# Patient Record
Sex: Female | Born: 1945 | Race: White | Hispanic: No | Marital: Married | Smoking: Current some day smoker
Health system: Southern US, Community
[De-identification: ages and names within clinical notes are randomized; demographics above are authoritative.]

## PROBLEM LIST (undated history)

## (undated) DIAGNOSIS — H269 Unspecified cataract: Secondary | ICD-10-CM

## (undated) HISTORY — PX: TONSILLECTOMY: SUR1361

## (undated) HISTORY — PX: FOOT SURGERY: SHX648

## (undated) HISTORY — DX: Unspecified cataract: H26.9

---

## 1998-04-14 ENCOUNTER — Other Ambulatory Visit: Admission: RE | Admit: 1998-04-14 | Discharge: 1998-04-14 | Payer: Self-pay | Admitting: *Deleted

## 1998-06-28 ENCOUNTER — Other Ambulatory Visit: Admission: RE | Admit: 1998-06-28 | Discharge: 1998-06-28 | Payer: Self-pay | Admitting: Obstetrics and Gynecology

## 1999-10-01 ENCOUNTER — Other Ambulatory Visit: Admission: RE | Admit: 1999-10-01 | Discharge: 1999-10-01 | Payer: Self-pay | Admitting: *Deleted

## 1999-10-15 ENCOUNTER — Encounter: Admission: RE | Admit: 1999-10-15 | Discharge: 1999-10-15 | Payer: Self-pay | Admitting: *Deleted

## 2000-10-09 ENCOUNTER — Other Ambulatory Visit: Admission: RE | Admit: 2000-10-09 | Discharge: 2000-10-09 | Payer: Self-pay | Admitting: *Deleted

## 2000-10-15 ENCOUNTER — Encounter: Admission: RE | Admit: 2000-10-15 | Discharge: 2000-10-15 | Payer: Self-pay | Admitting: Obstetrics and Gynecology

## 2000-10-15 ENCOUNTER — Encounter: Payer: Self-pay | Admitting: Obstetrics and Gynecology

## 2001-05-01 ENCOUNTER — Ambulatory Visit (HOSPITAL_COMMUNITY): Admission: RE | Admit: 2001-05-01 | Discharge: 2001-05-01 | Payer: Self-pay | Admitting: Internal Medicine

## 2001-05-01 ENCOUNTER — Encounter: Payer: Self-pay | Admitting: Internal Medicine

## 2001-10-15 ENCOUNTER — Encounter: Admission: RE | Admit: 2001-10-15 | Discharge: 2001-10-15 | Payer: Self-pay | Admitting: Obstetrics and Gynecology

## 2001-10-15 ENCOUNTER — Other Ambulatory Visit: Admission: RE | Admit: 2001-10-15 | Discharge: 2001-10-15 | Payer: Self-pay | Admitting: Obstetrics and Gynecology

## 2001-10-15 ENCOUNTER — Encounter: Payer: Self-pay | Admitting: Obstetrics and Gynecology

## 2002-10-18 ENCOUNTER — Encounter: Payer: Self-pay | Admitting: Obstetrics and Gynecology

## 2002-10-18 ENCOUNTER — Encounter: Admission: RE | Admit: 2002-10-18 | Discharge: 2002-10-18 | Payer: Self-pay | Admitting: Obstetrics and Gynecology

## 2002-10-18 ENCOUNTER — Other Ambulatory Visit: Admission: RE | Admit: 2002-10-18 | Discharge: 2002-10-18 | Payer: Self-pay | Admitting: Obstetrics & Gynecology

## 2003-04-23 ENCOUNTER — Emergency Department (HOSPITAL_COMMUNITY): Admission: EM | Admit: 2003-04-23 | Discharge: 2003-04-23 | Payer: Self-pay | Admitting: Emergency Medicine

## 2003-08-19 ENCOUNTER — Encounter: Payer: Self-pay | Admitting: Family Medicine

## 2003-08-19 ENCOUNTER — Ambulatory Visit (HOSPITAL_COMMUNITY): Admission: RE | Admit: 2003-08-19 | Discharge: 2003-08-19 | Payer: Self-pay | Admitting: Family Medicine

## 2003-10-19 ENCOUNTER — Encounter: Admission: RE | Admit: 2003-10-19 | Discharge: 2003-10-19 | Payer: Self-pay | Admitting: Obstetrics and Gynecology

## 2003-10-19 ENCOUNTER — Encounter: Payer: Self-pay | Admitting: Obstetrics and Gynecology

## 2003-10-24 ENCOUNTER — Other Ambulatory Visit: Admission: RE | Admit: 2003-10-24 | Discharge: 2003-10-24 | Payer: Self-pay | Admitting: Obstetrics & Gynecology

## 2004-07-09 ENCOUNTER — Ambulatory Visit (HOSPITAL_COMMUNITY): Admission: RE | Admit: 2004-07-09 | Discharge: 2004-07-09 | Payer: Self-pay | Admitting: Internal Medicine

## 2004-07-11 ENCOUNTER — Ambulatory Visit (HOSPITAL_COMMUNITY): Admission: RE | Admit: 2004-07-11 | Discharge: 2004-07-11 | Payer: Self-pay | Admitting: Family Medicine

## 2004-10-19 ENCOUNTER — Ambulatory Visit (HOSPITAL_COMMUNITY): Admission: RE | Admit: 2004-10-19 | Discharge: 2004-10-19 | Payer: Self-pay | Admitting: Obstetrics and Gynecology

## 2014-05-01 ENCOUNTER — Encounter (HOSPITAL_COMMUNITY): Payer: Self-pay | Admitting: Emergency Medicine

## 2014-05-01 ENCOUNTER — Emergency Department (INDEPENDENT_AMBULATORY_CARE_PROVIDER_SITE_OTHER)
Admission: EM | Admit: 2014-05-01 | Discharge: 2014-05-01 | Disposition: A | Discharge status: Home / Self Care | Payer: Medicare Other | Source: Home / Self Care | Attending: Family Medicine | Admitting: Family Medicine

## 2014-05-01 DIAGNOSIS — T148 Other injury of unspecified body region: Secondary | ICD-10-CM

## 2014-05-01 DIAGNOSIS — W57XXXA Bitten or stung by nonvenomous insect and other nonvenomous arthropods, initial encounter: Secondary | ICD-10-CM

## 2014-05-01 MED ORDER — CEPHALEXIN 500 MG PO CAPS: 500.0000 mg | ORAL_CAPSULE | Freq: Four times a day (QID) | ORAL | Status: DC

## 2014-05-01 MED ORDER — DOXYCYCLINE HYCLATE 100 MG PO CAPS: 100.0000 mg | ORAL_CAPSULE | Freq: Two times a day (BID) | ORAL | Status: DC

## 2014-05-01 NOTE — ED Notes (Signed)
Started with right foot pruritis early Sat morning; applied Benadryl cream, then later noticed tick between right 3rd & 4th toes - pt removed with tweezers.  Started with redness and swelling to foot yesterday - has not extended up to right ankle.  Denies fevers.

## 2014-05-01 NOTE — ED Provider Notes (Signed)
Michelle KohutJanet L Drake is a 68 y.o. female who presents to Urgent Care today for right foot pain and swelling. Patient can get engorged tick between her third and fourth toes yesterday. She developed rapid onset of swelling and pain in her foot. She denies any fevers or chills nausea vomiting or diarrhea. She has not tried any medications yet.   History reviewed. No pertinent past medical history. History  Substance Use Topics  . Smoking status: Current Every Day Smoker  . Smokeless tobacco: Not on file  . Alcohol Use: Yes     Comment: occasional   ROS as above Medications: No current facility-administered medications for this encounter.   Current Outpatient Prescriptions  Medication Sig Dispense Refill  . UNKNOWN TO PATIENT Multiple OTC supplements      . cephALEXin (KEFLEX) 500 MG capsule Take 1 capsule (500 mg total) by mouth 4 (four) times daily.  40 capsule  0  . doxycycline (VIBRAMYCIN) 100 MG capsule Take 1 capsule (100 mg total) by mouth 2 (two) times daily.  20 capsule  0    Exam:  BP 128/76  Pulse 83  Temp(Src) 98.1 F (36.7 C) (Oral)  Resp 16  SpO2 97% Gen: Well NAD Right foot: Erythematous extending to the mid foot and ankle area. No retained tick seen No fluctuance No results found for this or any previous visit (from the past 24 hour(s)). No results found.  Assessment and Plan: 68 y.o. female with tick bite and cellulitis. Plan to treat with doxycycline for tick borne illness, and staff. We'll use Keflex for strep. Followup with primary care provider.  Discussed warning signs or symptoms. Please see discharge instructions. Patient expresses understanding.    Rodolph BongEvan S Corretta Munce, MD 05/01/14 1309

## 2014-05-01 NOTE — Discharge Instructions (Signed)
Thank you for coming in today. Take doxycycline twice dialy.  Take keflex 4x daily.  Return as needed.   Tick Bite Information Ticks are insects that attach themselves to the skin and draw blood for food. There are various types of ticks. Common types include wood ticks and deer ticks. Most ticks live in shrubs and grassy areas. Ticks can climb onto your body when you make contact with leaves or grass where the tick is waiting. The most common places on the body for ticks to attach themselves are the scalp, neck, armpits, waist, and groin. Most tick bites are harmless, but sometimes ticks carry germs that cause diseases. These germs can be spread to a person during the tick's feeding process. The chance of a disease spreading through a tick bite depends on:   The type of tick.  Time of year.   How long the tick is attached.   Geographic location.  HOW CAN YOU PREVENT TICK BITES? Take these steps to help prevent tick bites when you are outdoors:  Wear protective clothing. Long sleeves and long pants are best.   Wear white clothes so you can see ticks more easily.  Tuck your pant legs into your socks.   If walking on a trail, stay in the middle of the trail to avoid brushing against bushes.  Avoid walking through areas with long grass.  Put insect repellent on all exposed skin and along boot tops, pant legs, and sleeve cuffs.   Check clothing, hair, and skin repeatedly and before going inside.   Brush off any ticks that are not attached.  Take a shower or bath as soon as possible after being outdoors.  WHAT IS THE PROPER WAY TO REMOVE A TICK? Ticks should be removed as soon as possible to help prevent diseases caused by tick bites. 1. If latex gloves are available, put them on before trying to remove a tick.  2. Using fine-point tweezers, grasp the tick as close to the skin as possible. You may also use curved forceps or a tick removal tool. Grasp the tick as close to  its head as possible. Avoid grasping the tick on its body. 3. Pull gently with steady upward pressure until the tick lets go. Do not twist the tick or jerk it suddenly. This may break off the tick's head or mouth parts. 4. Do not squeeze or crush the tick's body. This could force disease-carrying fluids from the tick into your body.  5. After the tick is removed, wash the bite area and your hands with soap and water or other disinfectant such as alcohol. 6. Apply a small amount of antiseptic cream or ointment to the bite site.  7. Wash and disinfect any instruments that were used.  Do not try to remove a tick by applying a hot match, petroleum jelly, or fingernail polish to the tick. These methods do not work and may increase the chances of disease being spread from the tick bite.  WHEN SHOULD YOU SEEK MEDICAL CARE? Contact your health care provider if you are unable to remove a tick from your skin or if a part of the tick breaks off and is stuck in the skin.  After a tick bite, you need to be aware of signs and symptoms that could be related to diseases spread by ticks. Contact your health care provider if you develop any of the following in the days or weeks after the tick bite:  Unexplained fever.  Rash. A circular  rash that appears days or weeks after the tick bite may indicate the possibility of Lyme disease. The rash may resemble a target with a bull's-eye and may occur at a different part of your body than the tick bite.  Redness and swelling in the area of the tick bite.   Tender, swollen lymph glands.   Diarrhea.   Weight loss.   Cough.   Fatigue.   Muscle, joint, or bone pain.   Abdominal pain.   Headache.   Lethargy or a change in your level of consciousness.  Difficulty walking or moving your legs.   Numbness in the legs.   Paralysis.  Shortness of breath.   Confusion.   Repeated vomiting.  Document Released: 12/13/2000 Document Revised:  10/06/2013 Document Reviewed: 05/26/2013 [facility] Patient Information 2014 [place], [place].   Cellulitis Cellulitis is an infection of the skin and the tissue beneath it. The infected area is usually red and tender. Cellulitis occurs most often in the arms and lower legs.  CAUSES  Cellulitis is caused by bacteria that enter the skin through cracks or cuts in the skin. The most common types of bacteria that cause cellulitis are Staphylococcus and Streptococcus. SYMPTOMS   Redness and warmth.  Swelling.  Tenderness or pain.  Fever. DIAGNOSIS  Your caregiver can usually determine what is wrong based on a physical exam. Blood tests may also be done. TREATMENT  Treatment usually involves taking an antibiotic medicine. HOME CARE INSTRUCTIONS   Take your antibiotics as directed. Finish them even if you start to feel better.  Keep the infected arm or leg elevated to reduce swelling.  Apply a warm cloth to the affected area up to 4 times per day to relieve pain.  Only take over-the-counter or prescription medicines for pain, discomfort, or fever as directed by your caregiver.  Keep all follow-up appointments as directed by your caregiver. SEEK MEDICAL CARE IF:   You notice red streaks coming from the infected area.  Your red area gets larger or turns dark in color.  Your bone or joint underneath the infected area becomes painful after the skin has healed.  Your infection returns in the same area or another area.  You notice a swollen bump in the infected area.  You develop new symptoms. SEEK IMMEDIATE MEDICAL CARE IF:   You have a fever.  You feel very sleepy.  You develop vomiting or diarrhea.  You have a general ill feeling (malaise) with muscle aches and pains. MAKE SURE YOU:   Understand these instructions.  Will watch your condition.  Will get help right away if you are not doing well or get worse. Document Released: 09/25/2005 Document Revised: 06/16/2012  Document Reviewed: 03/02/2012 [facility] Patient Information 2014 [place], [place].

## 2014-05-02 NOTE — ED Notes (Signed)
Called to report nausea, upset stomach. Spoke w Marina GravelL Presson, PA, who authorized Zofran 4 mg ODT x 20 . [place] [facility] [place] called in at pt request, spoke w pharmacist

## 2014-05-19 DIAGNOSIS — Z6828 Body mass index (BMI) 28.0-28.9, adult: Secondary | ICD-10-CM | POA: Diagnosis not present

## 2014-05-19 DIAGNOSIS — N390 Urinary tract infection, site not specified: Secondary | ICD-10-CM | POA: Diagnosis not present

## 2014-09-03 DIAGNOSIS — T6391XA Toxic effect of contact with unspecified venomous animal, accidental (unintentional), initial encounter: Secondary | ICD-10-CM | POA: Diagnosis not present

## 2014-09-19 DIAGNOSIS — Z6828 Body mass index (BMI) 28.0-28.9, adult: Secondary | ICD-10-CM | POA: Diagnosis not present

## 2014-09-19 DIAGNOSIS — J984 Other disorders of lung: Secondary | ICD-10-CM | POA: Diagnosis not present

## 2015-02-20 DIAGNOSIS — J209 Acute bronchitis, unspecified: Secondary | ICD-10-CM | POA: Diagnosis not present

## 2015-02-20 DIAGNOSIS — Z6828 Body mass index (BMI) 28.0-28.9, adult: Secondary | ICD-10-CM | POA: Diagnosis not present

## 2016-06-25 ENCOUNTER — Encounter (HOSPITAL_COMMUNITY): Payer: Self-pay | Admitting: Emergency Medicine

## 2016-06-25 ENCOUNTER — Ambulatory Visit (HOSPITAL_COMMUNITY)
Admission: EM | Admit: 2016-06-25 | Discharge: 2016-06-25 | Disposition: A | Discharge status: Home / Self Care | Payer: Medicare Other | Attending: Family Medicine | Admitting: Family Medicine

## 2016-06-25 DIAGNOSIS — W57XXXA Bitten or stung by nonvenomous insect and other nonvenomous arthropods, initial encounter: Secondary | ICD-10-CM | POA: Diagnosis not present

## 2016-06-25 DIAGNOSIS — T148 Other injury of unspecified body region: Secondary | ICD-10-CM

## 2016-06-25 MED ORDER — TRIAMCINOLONE ACETONIDE 0.1 % EX CREA: 1.0000 "application " | TOPICAL_CREAM | Freq: Two times a day (BID) | CUTANEOUS | Status: DC

## 2016-06-25 NOTE — ED Notes (Signed)
[facility] a [facility] possible [facility] bite that occurred 2 days ago. The patient stated that the site is warm to the touch, inflamed and itching.

## 2016-06-25 NOTE — ED Provider Notes (Signed)
CSN: [id number]     Arrival date & time 06/25/16  1348 History   First MD Initiated Contact with Patient 06/25/16 1408     Chief Complaint  Patient presents with  . Insect Bite   (Consider location/radiation/quality/duration/timing/severity/associated sxs/prior Treatment) HPI Comments: 70 year old female presents to the urgent care with complaint of insect bite to the right elbow. This occurred approximately 2 evenings ago. It is associated with itching and mild erythema and swelling. She states the swelling was worse yesterday but has subsided significantly today. I systemic symptoms. She is fully awake and alert, jovial, laughing and showing no signs of distress.   History reviewed. No pertinent past medical history. Past Surgical History  Procedure Laterality Date  . Foot surgery      x2  . Tonsillectomy    . Cesarean section     History reviewed. No pertinent family history. Social History  Substance Use Topics  . Smoking status: Current Every Day Smoker  . Smokeless tobacco: None  . Alcohol Use: Yes     Comment: occasional   OB History    No data available     Review of Systems  Constitutional: Negative.   HENT: Negative.   Respiratory: Negative for cough and shortness of breath.   Cardiovascular: Negative for chest pain.  Gastrointestinal: Negative.   Skin: Positive for color change.  Neurological: Negative.   All other systems reviewed and are negative.   Allergies  Other and Sulfa antibiotics  Home Medications   Prior to Admission medications   Medication Sig Start Date End Date Taking? Authorizing Provider  cephALEXin (KEFLEX) 500 MG capsule Take 1 capsule (500 mg total) by mouth 4 (four) times daily. 05/01/14   Rodolph BongEvan S Corey, MD  doxycycline (VIBRAMYCIN) 100 MG capsule Take 1 capsule (100 mg total) by mouth 2 (two) times daily. 05/01/14   Rodolph BongEvan S Corey, MD  triamcinolone cream (KENALOG) 0.1 % Apply 1 application topically 2 (two) times daily. 06/25/16   Hayden Rasmussenavid  Cornella Emmer, NP  UNKNOWN TO PATIENT Multiple OTC supplements    Historical Provider, MD   Meds Ordered and Administered this Visit  Medications - No data to display  BP 145/85 mmHg  Pulse 90  Temp(Src) 97.7 F (36.5 C) (Oral)  Resp 16  SpO2 98% No data found.   Physical Exam  Constitutional: She is oriented to person, place, and time. She appears well-developed and well-nourished. No distress.  Eyes: EOM are normal.  Neck: Normal range of motion. Neck supple.  Cardiovascular: Normal rate and regular rhythm.   Pulmonary/Chest: Effort normal. No respiratory distress. She has no wheezes.  Musculoskeletal: She exhibits no edema.  Neurological: She is alert and oriented to person, place, and time. She exhibits normal muscle tone.  Skin: Skin is warm and dry.  There is a 1 cm diameter roughly annular lesion to the right elbow. Under magnification this is a small crop of raised vesicles over light erythematous base. It has been using a scant amount of clear to honey-colored fluid producing a light crust over the surface. There is minor, light cutaneous erythema surrounding the elbow extending approximately 3-4 cm out. Currently no swelling. No apparent joint involvement. No tenderness. Exhibits full range of motion of the elbow without pain or limitation. There is no lymphangitis or evidence of cellulitis.  Psychiatric: She has a normal mood and affect.  Nursing note and vitals reviewed.   ED Course  Procedures (including critical care time)  Labs Review Labs Reviewed - No  data to display  Imaging Review No results found.   Visual Acuity Review  Right Eye Distance:   Left Eye Distance:   Bilateral Distance:    Right Eye Near:   Left Eye Near:    Bilateral Near:         MDM   1. Insect bite   Localized reaction only..  Use the triamcinolone cream twice a day. May also apply the Caladryl lotion do not apply it at the same time as the triamcinolone cream. Apply cold  compresses to the areas of itching. PREVENTION   Use insect repellent. The best insect repellents contain:  DEET, picaridin, oil of lemon eucalyptus (OLE), or IR3535.  Higher amounts of an active ingredient.  When you are outdoors, wear clothing that covers your arms and legs.  Avoid opening windows that do not have window screens. SEEK MEDICAL CARE IF:  You have increased redness, swelling, or pain in the bite area.  You have a fever. SEEK IMMEDIATE MEDICAL CARE IF:   You have joint pain.   You have fluid, blood, or pus coming from the bite area.  You have a headache or neck pain.  You have unusual weakness.  You have a rash.  You have chest pain or shortness of breath.  You have abdominal pain, nausea, or vomiting.  You feel unusually tired or sleepy.   This information is not intended to replace advice given to you by your health care provider. Make sure you discuss any questions you have with your health care provider.   Document Released: 01/23/2005 Document Revised: 09/06/2015 Document Reviewed: 05/03/2015 Elsevier Interactive Patient Education [address]2016 Elsevier [employer].      Hayden Rasmussenavid Efrat Zuidema, TexasNP 06/25/16 [phone]

## 2017-04-25 ENCOUNTER — Encounter (HOSPITAL_COMMUNITY): Payer: Self-pay | Admitting: *Deleted

## 2017-04-25 ENCOUNTER — Emergency Department (HOSPITAL_COMMUNITY): Payer: Medicare Other

## 2017-04-25 ENCOUNTER — Emergency Department (HOSPITAL_COMMUNITY)
Admission: EM | Admit: 2017-04-25 | Discharge: 2017-04-25 | Disposition: A | Discharge status: Home / Self Care | Payer: Medicare Other | Attending: Emergency Medicine | Admitting: Emergency Medicine

## 2017-04-25 DIAGNOSIS — S93401A Sprain of unspecified ligament of right ankle, initial encounter: Secondary | ICD-10-CM | POA: Insufficient documentation

## 2017-04-25 DIAGNOSIS — M7989 Other specified soft tissue disorders: Secondary | ICD-10-CM | POA: Diagnosis not present

## 2017-04-25 DIAGNOSIS — M79662 Pain in left lower leg: Secondary | ICD-10-CM | POA: Diagnosis not present

## 2017-04-25 DIAGNOSIS — S99912A Unspecified injury of left ankle, initial encounter: Secondary | ICD-10-CM | POA: Diagnosis not present

## 2017-04-25 DIAGNOSIS — Y9389 Activity, other specified: Secondary | ICD-10-CM | POA: Insufficient documentation

## 2017-04-25 DIAGNOSIS — Y929 Unspecified place or not applicable: Secondary | ICD-10-CM | POA: Diagnosis not present

## 2017-04-25 DIAGNOSIS — M25572 Pain in left ankle and joints of left foot: Secondary | ICD-10-CM | POA: Diagnosis not present

## 2017-04-25 DIAGNOSIS — Y999 Unspecified external cause status: Secondary | ICD-10-CM | POA: Insufficient documentation

## 2017-04-25 DIAGNOSIS — F17200 Nicotine dependence, unspecified, uncomplicated: Secondary | ICD-10-CM | POA: Insufficient documentation

## 2017-04-25 DIAGNOSIS — S93402A Sprain of unspecified ligament of left ankle, initial encounter: Secondary | ICD-10-CM | POA: Insufficient documentation

## 2017-04-25 DIAGNOSIS — S8992XA Unspecified injury of left lower leg, initial encounter: Secondary | ICD-10-CM | POA: Diagnosis not present

## 2017-04-25 DIAGNOSIS — W109XXA Fall (on) (from) unspecified stairs and steps, initial encounter: Secondary | ICD-10-CM | POA: Diagnosis not present

## 2017-04-25 MED ORDER — OXYCODONE-ACETAMINOPHEN 5-325 MG PO TABS
1.0000 | ORAL_TABLET | Freq: Once | ORAL | Status: AC
  Administered 2017-04-25: 1 via ORAL
  Filled 2017-04-25: qty 1

## 2017-04-25 MED ORDER — TRAMADOL HCL 50 MG PO TABS: 50.0000 mg | ORAL_TABLET | Freq: Four times a day (QID) | ORAL | 0 refills | Status: DC | PRN

## 2017-04-25 NOTE — ED Notes (Signed)
Pt alert & oriented x4, stable gait. Patient given discharge instructions, paperwork & prescription(s). Patient verbalized understanding. Pt left department in wheelchair pt left w/ no further questions.

## 2017-04-25 NOTE — ED Triage Notes (Signed)
Trying to keep geriatric dog from falling down stairs and slipped hyperextending both feet Now with bilateral foot pain  [facility]

## 2017-04-25 NOTE — Discharge Instructions (Signed)
Elevate and apply ice packs on/off to your ankles.  You may want to use a pair of crutches for few days for support.  Call the orthopedic doctor listed to arrange a follow-up appt in one week if the pain is not improving

## 2017-04-27 NOTE — ED Provider Notes (Signed)
[facility]EM[facility]Y DEPT Provider Note   CSN: [id number] Arrival date & time: 04/25/17  1933     History   Chief Complaint Chief Complaint  Patient presents with  . Ankle Pain    HPI Michelle Drake is a 71 y.o. female.  HPI   Michelle Drake is a 71 y.o. female who presents to the Emergency Department complaining of bilateral  Ankle pain that began after a mechanical fall that occurred shortly before ER arrival.  She states that she slipped down one step while trying to prevent her elderly dog from falling.  She complains of lateral ankle pain and swelling that is worse with movement and weight bearing and tenderness of the lateral left lower leg.  She denies other injuries, numbness   History reviewed. No pertinent past medical history.  There are no active problems to display for this patient.   Past Surgical History:  Procedure Laterality Date  . CESAREAN SECTION    . FOOT SURGERY     x2  . TONSILLECTOMY      OB History    No data available       Home Medications    Prior to Admission medications   Medication Sig Start Date End Date Taking? Authorizing Provider  Multiple Vitamin (MULTIVITAMIN WITH MINERALS) TABS tablet Take 1 tablet by mouth daily.   Yes Historical Provider, MD  traMADol (ULTRAM) 50 MG tablet Take 1 tablet (50 mg total) by mouth every 6 (six) hours as needed. 04/25/17   Sitara Cashwell, PA-C    Family History History reviewed. No pertinent family history.  Social History Social History  Substance Use Topics  . Smoking status: Current Some Day Smoker  . Smokeless tobacco: Never Used  . Alcohol use Yes     Comment: occasional     Allergies   Other and Sulfa antibiotics   Review of Systems Review of Systems  Constitutional: Negative for chills and fever.  Genitourinary: Negative for difficulty urinating and dysuria.  Musculoskeletal: Positive for arthralgias and joint swelling.  Skin: Negative for color change and wound.  All other  systems reviewed and are negative.    Physical Exam Updated Vital Signs BP 128/60 (BP Location: Left Arm)   Pulse 87   Temp 98.1 F (36.7 C) (Oral)   Resp 15   Ht  (1.575 m)   Wt 70.3 kg   SpO2 100%   BMI 28.35 kg/m   Physical Exam  Constitutional: She is oriented to person, place, and time. She appears well-developed and well-nourished. No distress.  HENT:  Head: Normocephalic and atraumatic.  Neck: Normal range of motion.  Cardiovascular: Normal rate, regular rhythm and intact distal pulses.   Pulmonary/Chest: Effort normal and breath sounds normal. She exhibits no tenderness.  Musculoskeletal: She exhibits edema and tenderness. She exhibits no deformity.  ttp and mild edema of lateral aspect of bilateral ankles, right > left.  Mild tenderness of the lateral left lower leg, no edema.  No erythema, abrasion, bruising or bony deformity.  No proximal tenderness. Compartments soft  Neurological: She is alert and oriented to person, place, and time. She exhibits normal muscle tone. Coordination normal.  Skin: Skin is warm and dry. Capillary refill takes less than 2 seconds.  Nursing note and vitals reviewed.    ED Treatments / Results  Labs (all labs ordered are listed, but only abnormal results are displayed) Labs Reviewed - No data to display  EKG  EKG Interpretation None  Radiology Dg Tibia/fibula Left  Result Date: 04/25/2017 CLINICAL DATA:  Pain after fall EXAM: LEFT TIBIA AND FIBULA - 2 VIEW COMPARISON:  None. FINDINGS: The distal tibia and fibula were described on the ankle films. No fractures in the proximal tibia or fibula. IMPRESSION: No fractures in the proximal tibia or fibula. Electronically Signed   By: Gerome Sam III M.D   On: 04/25/2017 21:03   Dg Ankle Complete Left  Result Date: 04/25/2017 CLINICAL DATA:  Pain after trauma EXAM: LEFT ANKLE COMPLETE - 3+ VIEW COMPARISON:  None. FINDINGS: Bilateral soft tissue swelling. The ankle mortise  is intact. Irregularity of both malleoli has a primarily chronic appearance. No definitive acute fracture. Degenerative changes in the midfoot. An unusual contour to the talus on the lateral view may be due to positioning, a developmental variant, or previous trauma. No definitive acute fracture. No other abnormalities. IMPRESSION: 1. No acute fracture identified. Evidence of previous trauma with irregularity of the bilateral malleoli. Unusual contour to the talus is favored to be nonacute as well. Bilateral soft tissue swelling. Electronically Signed   By: Gerome Sam III M.D   On: 04/25/2017 21:02   Dg Ankle Complete Right  Result Date: 04/25/2017 CLINICAL DATA:  Pain after fall. EXAM: RIGHT ANKLE - COMPLETE 3+ VIEW COMPARISON:  None. FINDINGS: Lateral soft tissue swelling. No fractures. The ankle mortise is intact. Degenerative changes in the talonavicular joint. No acute abnormalities. IMPRESSION: Lateral soft tissue swelling with no identified fracture. Degenerative changes as noted above. Electronically Signed   By: Gerome Sam III M.D   On: 04/25/2017 20:59    Procedures Procedures (including critical care time)  Medications Ordered in ED Medications  oxyCODONE-acetaminophen (PERCOCET/ROXICET) 5-325 MG per tablet 1 tablet (1 tablet Oral Given 04/25/17 2132)     Initial Impression / Assessment and Plan / ED Course  I have reviewed the triage vital signs and the nursing notes.  Pertinent labs & imaging results that were available during my care of the patient were reviewed by me and considered in my medical decision making (see chart for details).     XR's neg for acute fx's.  NV intact.    Bilateral ASO applied by nursing.  Pain improved.  Pt has crutches at home.  agees to RICE therapy.  Orthopedic f/u if needed.  Final Clinical Impressions(s) / ED Diagnoses   Final diagnoses:  Sprain of right ankle, unspecified ligament, initial encounter  Sprain of left ankle,  unspecified ligament, initial encounter    New Prescriptions Discharge Medication List as of 04/25/2017  9:43 PM    START taking these medications   Details  traMADol (ULTRAM) 50 MG tablet Take 1 tablet (50 mg total) by mouth every 6 (six) hours as needed., Starting Fri 04/25/2017, Print         Kelechi Orgeron Haynes, PA-C 04/27/17 1259    Bethann Berkshire, MD 04/28/17 1300

## 2017-07-29 ENCOUNTER — Emergency Department (HOSPITAL_COMMUNITY)
Admission: EM | Admit: 2017-07-29 | Discharge: 2017-07-29 | Disposition: A | Discharge status: Home / Self Care | Payer: Medicare Other | Attending: Emergency Medicine | Admitting: Emergency Medicine

## 2017-07-29 ENCOUNTER — Emergency Department (HOSPITAL_COMMUNITY): Payer: Medicare Other

## 2017-07-29 ENCOUNTER — Encounter (HOSPITAL_COMMUNITY): Payer: Self-pay | Admitting: Emergency Medicine

## 2017-07-29 DIAGNOSIS — Z79899 Other long term (current) drug therapy: Secondary | ICD-10-CM | POA: Insufficient documentation

## 2017-07-29 DIAGNOSIS — F17200 Nicotine dependence, unspecified, uncomplicated: Secondary | ICD-10-CM | POA: Diagnosis not present

## 2017-07-29 DIAGNOSIS — M25462 Effusion, left knee: Secondary | ICD-10-CM | POA: Diagnosis not present

## 2017-07-29 DIAGNOSIS — M25562 Pain in left knee: Secondary | ICD-10-CM | POA: Diagnosis not present

## 2017-07-29 DIAGNOSIS — M7989 Other specified soft tissue disorders: Secondary | ICD-10-CM | POA: Diagnosis not present

## 2017-07-29 MED ORDER — ACETAMINOPHEN 500 MG PO TABS
1000.0000 mg | ORAL_TABLET | Freq: Once | ORAL | Status: AC
  Administered 2017-07-29: 1000 mg via ORAL
  Filled 2017-07-29: qty 2

## 2017-07-29 NOTE — ED Triage Notes (Addendum)
Pt going down steps and heard a pop in left knee. States sprained both ankles 3 months ago so increased pressure on knees. Nad. No obvious deformity but mild swelling noted to left knee.

## 2017-07-29 NOTE — ED Provider Notes (Signed)
[facility] DEPT Provider Note   CSN: [id number] Arrival date & time: 07/29/17  1247     History   Chief Complaint Chief Complaint  Patient presents with  . Knee Pain    HPI Mellissa KohutJanet L Tomaszewski is a 71 y.o. female with a history of bilateral ankle sprains about 3 months ago who has been having increased knee pain bilaterally.  Yesterday she was walking up stairs and felt her left knee pop with pain in the back.  She said it felt like her knee was trying to go backwards.  She denies numbness or tingling.  She took one ibuprofen about 2 hours PTA for her pain.   HPI  History reviewed. No pertinent past medical history.  There are no active problems to display for this patient.   Past Surgical History:  Procedure Laterality Date  . CESAREAN SECTION    . FOOT SURGERY     x2  . TONSILLECTOMY      OB History    No data available       Home Medications    Prior to Admission medications   Medication Sig Start Date End Date Taking? Authorizing Provider  Multiple Vitamin (MULTIVITAMIN WITH MINERALS) TABS tablet Take 1 tablet by mouth daily.    [provider]  traMADol (ULTRAM) 50 MG tablet Take 1 tablet (50 mg total) by mouth every 6 (six) hours as needed. 04/25/17   Pauline Ausriplett, Tammy, PA-C    Family History History reviewed. No pertinent family history.  Social History Social History  Substance Use Topics  . Smoking status: Current Some Day Smoker  . Smokeless tobacco: Never Used  . Alcohol use Yes     Comment: occasional     Allergies   Other and Sulfa antibiotics   Review of Systems Review of Systems  Constitutional: Negative for appetite change and fever.  Musculoskeletal: Positive for arthralgias. Negative for back pain, joint swelling and myalgias.  Skin: Negative for color change and wound.  All other systems reviewed and are negative.    Physical Exam Updated Vital Signs BP 133/73 (BP Location: Right Arm)   Pulse 94   Temp 98.4 F (36.9  C) (Oral)   Resp 18   Ht 5\' 1"  (1.549 m)   Wt 69.9 kg (154 lb)   SpO2 98%   BMI 29.10 kg/m   Physical Exam  Constitutional: She appears well-developed and well-nourished.  HENT:  Head: Normocephalic and atraumatic.  Musculoskeletal:  Pain and mild swelling to left knee. Knee is grossly stable to anterior/posterior drawer test and pelvis/varus stress. Left foot is warm and well perfused with intact motor and sensory function.  No tenderness to proximal tibia or fibula.   Neurological: She is alert. No sensory deficit.  Skin: Skin is warm and dry. She is not diaphoretic.  Psychiatric: She has a normal mood and affect. Her behavior is normal.  Nursing note and vitals reviewed.    ED Treatments / Results  Labs (all labs ordered are listed, but only abnormal results are displayed) Labs Reviewed - No data to display  EKG  EKG Interpretation None       Radiology Dg Knee Complete 4 Views Left  Result Date: 07/29/2017 CLINICAL DATA:  Pop in the left knee going downstairs with swelling. Initial encounter. EXAM: LEFT KNEE - COMPLETE 4+ VIEW COMPARISON:  Tibia radiograph 04/25/2017 FINDINGS: No evidence of fracture, dislocation, or joint effusion. Minimal marginal spurring without detected joint narrowing. IMPRESSION: No acute finding. Electronically Signed  By: Marnee SpringJonathon  Watts M.D.   On: 07/29/2017 13:12    Procedures Procedures (including critical care time)  Medications Ordered in ED Medications  acetaminophen (TYLENOL) tablet 1,000 mg (1,000 mg Oral Given 07/29/17 1500)     Initial Impression / Assessment and Plan / ED Course  I have reviewed the triage vital signs and the nursing notes.  Pertinent labs & imaging results that were available during my care of the patient were reviewed by me and considered in my medical decision making (see chart for details).    Mellissa KohutJanet L Niu presents with acute on chronic left knee pain in her left knee after hearing a pop while  walking up steps.  Foot is warm and well perfused.  Knee is grossly stable to my physical exam. Imaging obtained with out evidence of acute abnormality or joint effusion.  Patient given instructions on OTC pain medication and given follow-up with orthopedics. She was given Tylenol, knee immobilizer, and crutches while in the emergency room. Patient given return precautions, states her understanding.   The patient was also seen by Dr. Rhunette CroftNanavati who agrees with my plan.    Final Clinical Impressions(s) / ED Diagnoses   Final diagnoses:  Acute pain of left knee    New Prescriptions New Prescriptions   No medications on file     Norman ClayHammond, Zeanna Sunde W, PA-C 07/29/17 1502    Derwood KaplanNanavati, Ankit, MD 07/29/17 [phone]

## 2017-07-29 NOTE — Discharge Instructions (Signed)

## 2017-08-13 DIAGNOSIS — M222X1 Patellofemoral disorders, right knee: Secondary | ICD-10-CM | POA: Diagnosis not present

## 2017-08-13 DIAGNOSIS — Z8739 Personal history of other diseases of the musculoskeletal system and connective tissue: Secondary | ICD-10-CM | POA: Diagnosis not present

## 2017-08-13 DIAGNOSIS — M222X2 Patellofemoral disorders, left knee: Secondary | ICD-10-CM | POA: Diagnosis not present

## 2017-08-13 DIAGNOSIS — M25562 Pain in left knee: Secondary | ICD-10-CM | POA: Diagnosis not present

## 2017-08-14 DIAGNOSIS — S93431S Sprain of tibiofibular ligament of right ankle, sequela: Secondary | ICD-10-CM | POA: Diagnosis not present

## 2017-08-14 DIAGNOSIS — M25561 Pain in right knee: Secondary | ICD-10-CM | POA: Diagnosis not present

## 2017-08-14 DIAGNOSIS — S93432S Sprain of tibiofibular ligament of left ankle, sequela: Secondary | ICD-10-CM | POA: Diagnosis not present

## 2017-08-14 DIAGNOSIS — M25562 Pain in left knee: Secondary | ICD-10-CM | POA: Diagnosis not present

## 2017-08-19 DIAGNOSIS — M25561 Pain in right knee: Secondary | ICD-10-CM | POA: Diagnosis not present

## 2017-08-19 DIAGNOSIS — M25562 Pain in left knee: Secondary | ICD-10-CM | POA: Diagnosis not present

## 2017-08-19 DIAGNOSIS — S93431S Sprain of tibiofibular ligament of right ankle, sequela: Secondary | ICD-10-CM | POA: Diagnosis not present

## 2017-08-19 DIAGNOSIS — S93432S Sprain of tibiofibular ligament of left ankle, sequela: Secondary | ICD-10-CM | POA: Diagnosis not present

## 2017-08-21 DIAGNOSIS — S93431S Sprain of tibiofibular ligament of right ankle, sequela: Secondary | ICD-10-CM | POA: Diagnosis not present

## 2017-08-21 DIAGNOSIS — M25561 Pain in right knee: Secondary | ICD-10-CM | POA: Diagnosis not present

## 2017-08-21 DIAGNOSIS — M25562 Pain in left knee: Secondary | ICD-10-CM | POA: Diagnosis not present

## 2017-08-21 DIAGNOSIS — S93432S Sprain of tibiofibular ligament of left ankle, sequela: Secondary | ICD-10-CM | POA: Diagnosis not present

## 2017-08-26 DIAGNOSIS — S93431S Sprain of tibiofibular ligament of right ankle, sequela: Secondary | ICD-10-CM | POA: Diagnosis not present

## 2017-08-26 DIAGNOSIS — M25561 Pain in right knee: Secondary | ICD-10-CM | POA: Diagnosis not present

## 2017-08-26 DIAGNOSIS — S93432S Sprain of tibiofibular ligament of left ankle, sequela: Secondary | ICD-10-CM | POA: Diagnosis not present

## 2017-08-26 DIAGNOSIS — M25562 Pain in left knee: Secondary | ICD-10-CM | POA: Diagnosis not present

## 2017-08-28 DIAGNOSIS — S93432S Sprain of tibiofibular ligament of left ankle, sequela: Secondary | ICD-10-CM | POA: Diagnosis not present

## 2017-08-28 DIAGNOSIS — M25561 Pain in right knee: Secondary | ICD-10-CM | POA: Diagnosis not present

## 2017-08-28 DIAGNOSIS — M25562 Pain in left knee: Secondary | ICD-10-CM | POA: Diagnosis not present

## 2017-08-28 DIAGNOSIS — S93431S Sprain of tibiofibular ligament of right ankle, sequela: Secondary | ICD-10-CM | POA: Diagnosis not present

## 2017-09-02 DIAGNOSIS — S93432S Sprain of tibiofibular ligament of left ankle, sequela: Secondary | ICD-10-CM | POA: Diagnosis not present

## 2017-09-02 DIAGNOSIS — M25562 Pain in left knee: Secondary | ICD-10-CM | POA: Diagnosis not present

## 2017-09-02 DIAGNOSIS — S93431S Sprain of tibiofibular ligament of right ankle, sequela: Secondary | ICD-10-CM | POA: Diagnosis not present

## 2017-09-02 DIAGNOSIS — M25561 Pain in right knee: Secondary | ICD-10-CM | POA: Diagnosis not present

## 2017-09-04 DIAGNOSIS — M25562 Pain in left knee: Secondary | ICD-10-CM | POA: Diagnosis not present

## 2017-09-04 DIAGNOSIS — M25561 Pain in right knee: Secondary | ICD-10-CM | POA: Diagnosis not present

## 2017-09-04 DIAGNOSIS — S93431S Sprain of tibiofibular ligament of right ankle, sequela: Secondary | ICD-10-CM | POA: Diagnosis not present

## 2017-09-04 DIAGNOSIS — S93432S Sprain of tibiofibular ligament of left ankle, sequela: Secondary | ICD-10-CM | POA: Diagnosis not present

## 2017-09-09 DIAGNOSIS — M25562 Pain in left knee: Secondary | ICD-10-CM | POA: Diagnosis not present

## 2017-09-09 DIAGNOSIS — S93431S Sprain of tibiofibular ligament of right ankle, sequela: Secondary | ICD-10-CM | POA: Diagnosis not present

## 2017-09-09 DIAGNOSIS — M25561 Pain in right knee: Secondary | ICD-10-CM | POA: Diagnosis not present

## 2017-09-09 DIAGNOSIS — S93432S Sprain of tibiofibular ligament of left ankle, sequela: Secondary | ICD-10-CM | POA: Diagnosis not present

## 2017-09-11 DIAGNOSIS — S93432S Sprain of tibiofibular ligament of left ankle, sequela: Secondary | ICD-10-CM | POA: Diagnosis not present

## 2017-09-11 DIAGNOSIS — M25562 Pain in left knee: Secondary | ICD-10-CM | POA: Diagnosis not present

## 2017-09-11 DIAGNOSIS — M25561 Pain in right knee: Secondary | ICD-10-CM | POA: Diagnosis not present

## 2017-09-11 DIAGNOSIS — S93431S Sprain of tibiofibular ligament of right ankle, sequela: Secondary | ICD-10-CM | POA: Diagnosis not present

## 2017-09-15 DIAGNOSIS — M25561 Pain in right knee: Secondary | ICD-10-CM | POA: Diagnosis not present

## 2017-09-15 DIAGNOSIS — M25562 Pain in left knee: Secondary | ICD-10-CM | POA: Diagnosis not present

## 2017-09-15 DIAGNOSIS — S93432S Sprain of tibiofibular ligament of left ankle, sequela: Secondary | ICD-10-CM | POA: Diagnosis not present

## 2017-09-15 DIAGNOSIS — S93431S Sprain of tibiofibular ligament of right ankle, sequela: Secondary | ICD-10-CM | POA: Diagnosis not present

## 2017-09-18 DIAGNOSIS — S93431S Sprain of tibiofibular ligament of right ankle, sequela: Secondary | ICD-10-CM | POA: Diagnosis not present

## 2017-09-18 DIAGNOSIS — S93432S Sprain of tibiofibular ligament of left ankle, sequela: Secondary | ICD-10-CM | POA: Diagnosis not present

## 2017-09-18 DIAGNOSIS — M25561 Pain in right knee: Secondary | ICD-10-CM | POA: Diagnosis not present

## 2017-09-18 DIAGNOSIS — M25562 Pain in left knee: Secondary | ICD-10-CM | POA: Diagnosis not present

## 2017-09-23 DIAGNOSIS — M25562 Pain in left knee: Secondary | ICD-10-CM | POA: Diagnosis not present

## 2017-09-23 DIAGNOSIS — M25561 Pain in right knee: Secondary | ICD-10-CM | POA: Diagnosis not present

## 2017-09-23 DIAGNOSIS — S93431S Sprain of tibiofibular ligament of right ankle, sequela: Secondary | ICD-10-CM | POA: Diagnosis not present

## 2017-09-23 DIAGNOSIS — S93432S Sprain of tibiofibular ligament of left ankle, sequela: Secondary | ICD-10-CM | POA: Diagnosis not present

## 2017-09-24 DIAGNOSIS — Z8739 Personal history of other diseases of the musculoskeletal system and connective tissue: Secondary | ICD-10-CM | POA: Diagnosis not present

## 2017-09-24 DIAGNOSIS — M222X1 Patellofemoral disorders, right knee: Secondary | ICD-10-CM | POA: Diagnosis not present

## 2017-09-24 DIAGNOSIS — M222X2 Patellofemoral disorders, left knee: Secondary | ICD-10-CM | POA: Diagnosis not present

## 2017-09-24 DIAGNOSIS — M25562 Pain in left knee: Secondary | ICD-10-CM | POA: Diagnosis not present

## 2017-09-25 DIAGNOSIS — M25561 Pain in right knee: Secondary | ICD-10-CM | POA: Diagnosis not present

## 2017-09-25 DIAGNOSIS — S93431S Sprain of tibiofibular ligament of right ankle, sequela: Secondary | ICD-10-CM | POA: Diagnosis not present

## 2017-09-25 DIAGNOSIS — M25562 Pain in left knee: Secondary | ICD-10-CM | POA: Diagnosis not present

## 2017-09-25 DIAGNOSIS — S93432S Sprain of tibiofibular ligament of left ankle, sequela: Secondary | ICD-10-CM | POA: Diagnosis not present

## 2018-10-01 IMAGING — DX DG KNEE COMPLETE 4+V*L*
4 series · 4 of 4 positions shown · non-contrast
Comparison: Tibia radiograph 04/25/2017

CLINICAL DATA: Pop in the left knee going downstairs with swelling.
Initial encounter.

EXAM:
LEFT KNEE - COMPLETE 4+ VIEW

[knee ap]
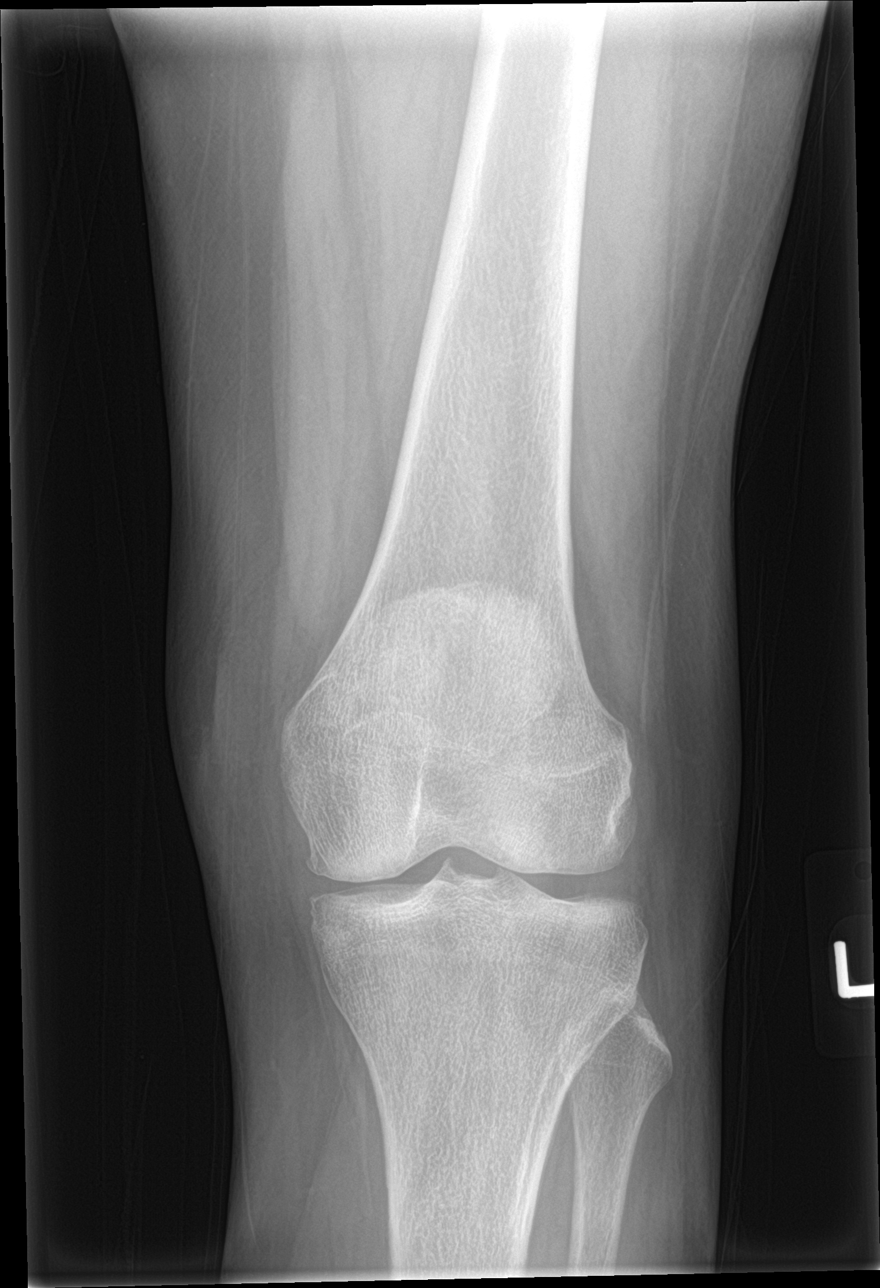

[tunnel]
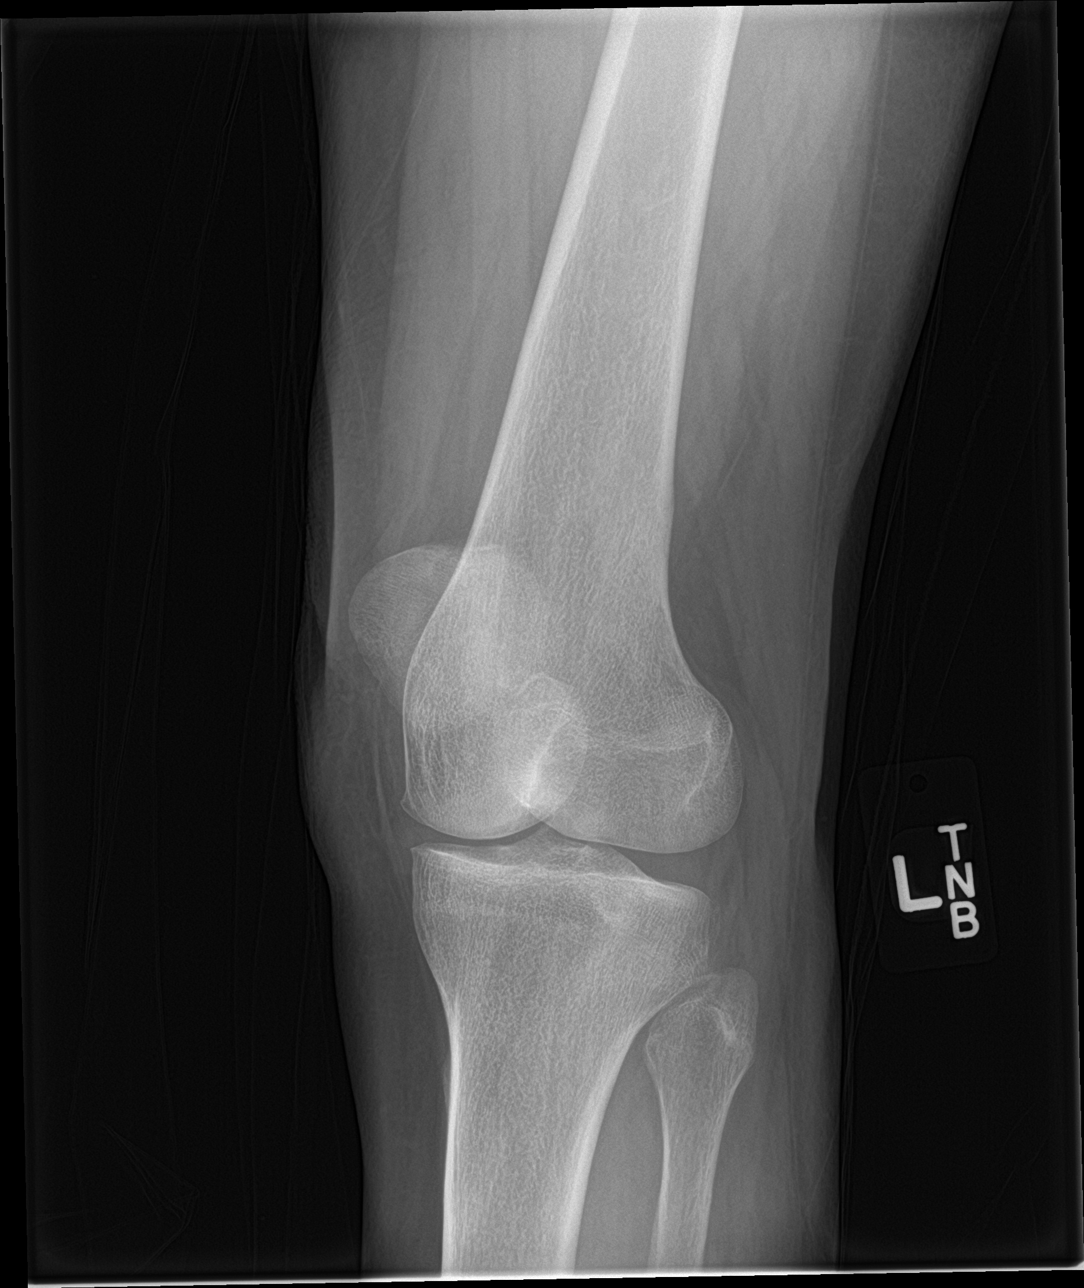

[knee lat (1 of 2)]
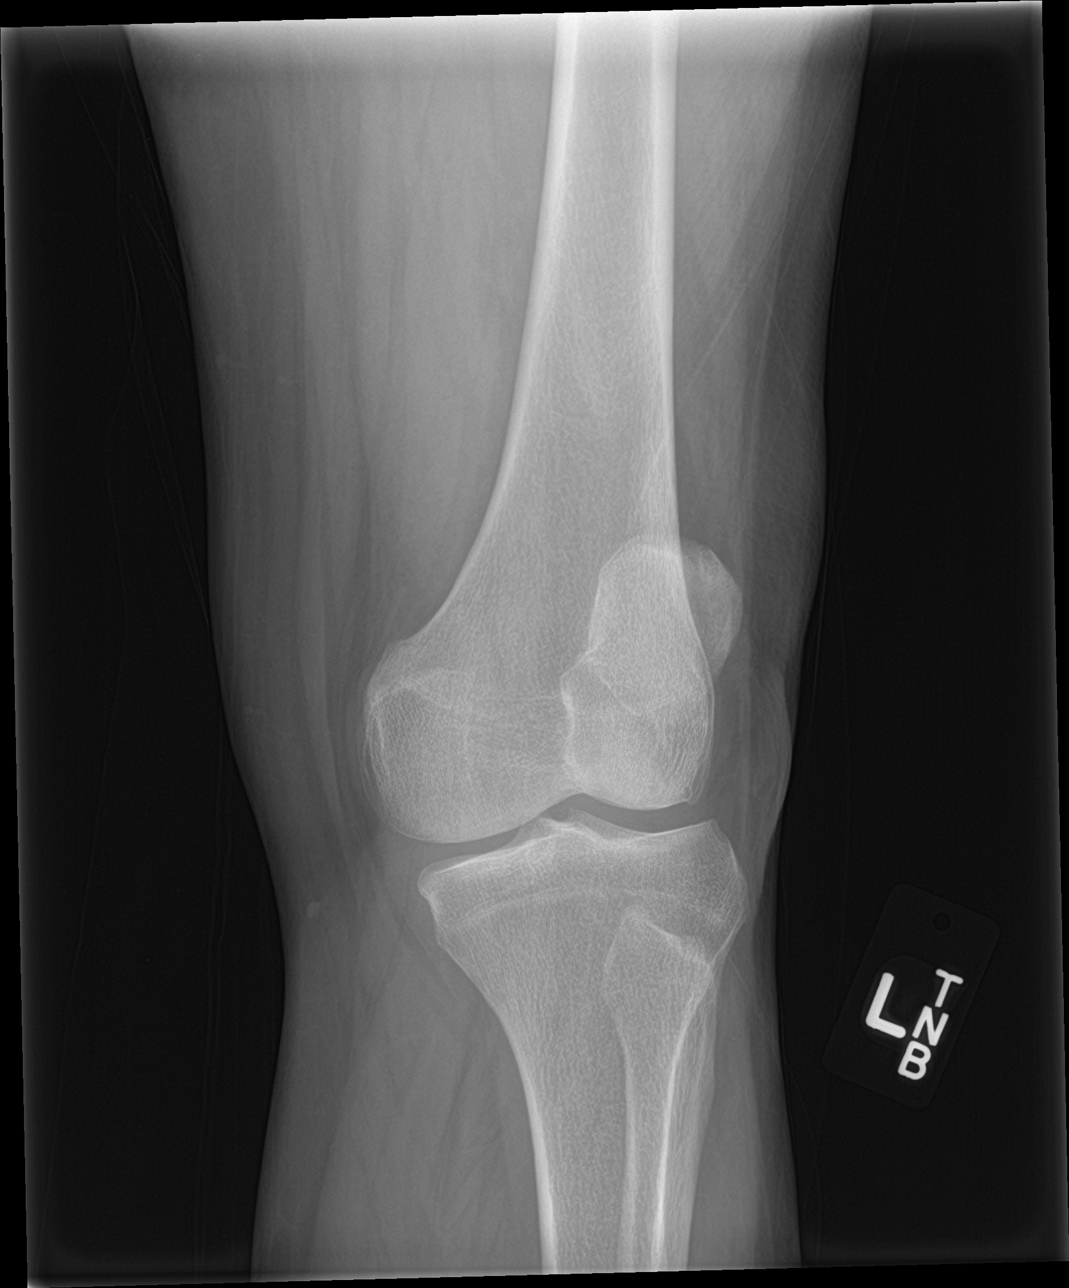

[knee lat (2 of 2)]
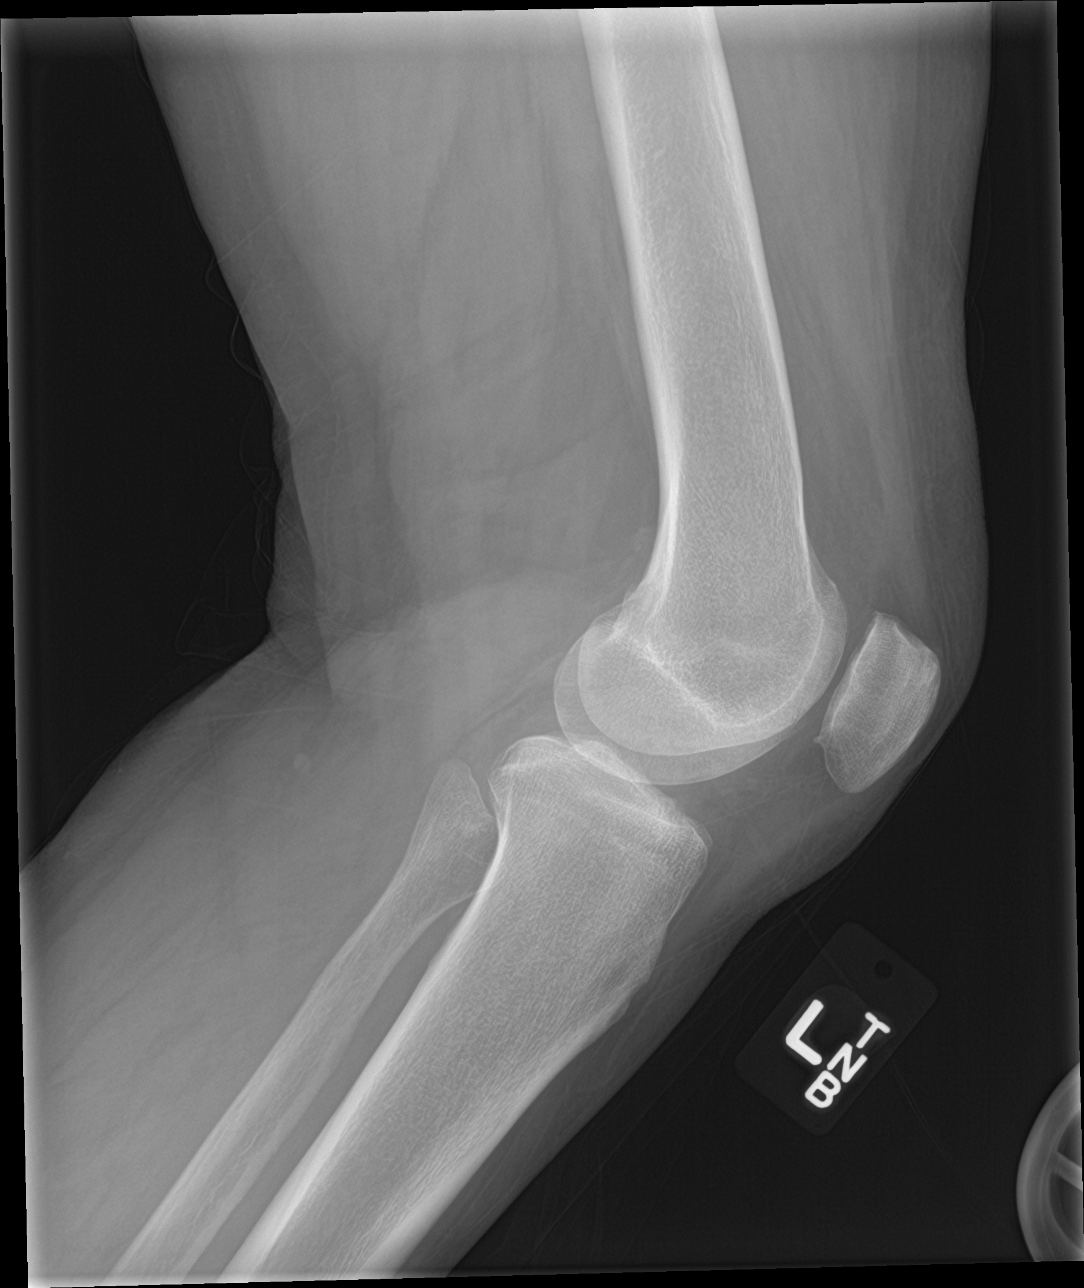

[4 of 4 positions shown; findings below may reference images not displayed]

FINDINGS: No evidence of fracture, dislocation, or joint effusion. Minimal
marginal spurring without detected joint narrowing.
IMPRESSION: No acute finding.

## 2018-12-01 DIAGNOSIS — J019 Acute sinusitis, unspecified: Secondary | ICD-10-CM | POA: Diagnosis not present

## 2018-12-01 DIAGNOSIS — E663 Overweight: Secondary | ICD-10-CM | POA: Diagnosis not present

## 2018-12-01 DIAGNOSIS — Z0001 Encounter for general adult medical examination with abnormal findings: Secondary | ICD-10-CM | POA: Diagnosis not present

## 2018-12-16 ENCOUNTER — Other Ambulatory Visit (HOSPITAL_COMMUNITY): Payer: Self-pay | Admitting: Physician Assistant

## 2018-12-16 DIAGNOSIS — Z1231 Encounter for screening mammogram for malignant neoplasm of breast: Secondary | ICD-10-CM

## 2018-12-16 DIAGNOSIS — Z780 Asymptomatic menopausal state: Secondary | ICD-10-CM

## 2019-08-17 DIAGNOSIS — Z681 Body mass index (BMI) 19 or less, adult: Secondary | ICD-10-CM | POA: Diagnosis not present

## 2019-08-17 DIAGNOSIS — T07XXXA Unspecified multiple injuries, initial encounter: Secondary | ICD-10-CM | POA: Diagnosis not present

## 2020-04-03 DIAGNOSIS — Z23 Encounter for immunization: Secondary | ICD-10-CM | POA: Diagnosis not present

## 2020-05-01 DIAGNOSIS — Z23 Encounter for immunization: Secondary | ICD-10-CM | POA: Diagnosis not present

## 2020-07-24 DIAGNOSIS — Z1389 Encounter for screening for other disorder: Secondary | ICD-10-CM | POA: Diagnosis not present

## 2020-07-24 DIAGNOSIS — Z0000 Encounter for general adult medical examination without abnormal findings: Secondary | ICD-10-CM | POA: Diagnosis not present

## 2020-07-24 DIAGNOSIS — Z6829 Body mass index (BMI) 29.0-29.9, adult: Secondary | ICD-10-CM | POA: Diagnosis not present

## 2020-07-24 DIAGNOSIS — R7309 Other abnormal glucose: Secondary | ICD-10-CM | POA: Diagnosis not present

## 2020-07-24 DIAGNOSIS — E782 Mixed hyperlipidemia: Secondary | ICD-10-CM | POA: Diagnosis not present

## 2020-07-24 DIAGNOSIS — E663 Overweight: Secondary | ICD-10-CM | POA: Diagnosis not present

## 2020-12-04 DIAGNOSIS — H35711 Central serous chorioretinopathy, right eye: Secondary | ICD-10-CM | POA: Diagnosis not present

## 2020-12-04 DIAGNOSIS — H524 Presbyopia: Secondary | ICD-10-CM | POA: Diagnosis not present

## 2020-12-06 NOTE — Progress Notes (Deleted)
[facility] - Clinic Note  12/07/2020     CHIEF COMPLAINT Patient presents for Retina Evaluation   HISTORY OF PRESENT ILLNESS: Michelle Drake is a 74 y.o. female who presents to the clinic today for:   HPI    Retina Evaluation    In right eye.  This started days ago.  Duration of days.  Context:  distance vision, mid-range vision and near vision.  Treatments tried include artificial tears.  Response to treatment was no improvement.  I, the attending physician,  performed the HPI with the patient and updated documentation appropriately.          Comments    74 y/o female pt referred by Dr. Alben Spittle on 12.6.21 for eval of CSR OD.  Pts vision OD has been gradually getting blurrier over the past several days.  No problems with vision OS.  Denies pain, FOL, floaters.  AT prn OU.       Last edited by Rennis Chris, MD on 12/07/2020  9:03 AM. (History)    pt here on the referral of Dr. Alben Spittle for concern of CSR OD, pt states she has been under an extreme amount of stress over the last year and especially the last month or so, pt lost her mother in law and 2 dogs, pt is very emotional  Referring physician: Wynell Balloon, MD [address] [place] [address]  HISTORICAL INFORMATION:   Selected notes from the MEDICAL RECORD NUMBER Referred by Dr. Alben Spittle LEE: 12.06.21 BCVA: 20/25 OU Ocular Hx- CSR OD, Cataracts OU    CURRENT MEDICATIONS: No current outpatient medications on file. (Ophthalmic Drugs)   No current facility-administered medications for this visit. (Ophthalmic Drugs)   Current Outpatient Medications (Other)  Medication Sig  . Multiple Vitamin (MULTIVITAMIN WITH MINERALS) TABS tablet Take 1 tablet by mouth daily.  . traMADol (ULTRAM) 50 MG tablet Take 1 tablet (50 mg total) by mouth every 6 (six) hours as needed.   No current facility-administered medications for this visit. (Other)      REVIEW OF SYSTEMS: ROS    Positive for:  Eyes   Negative for: Constitutional, Gastrointestinal, Neurological, Skin, Genitourinary, Musculoskeletal, HENT, Endocrine, Cardiovascular, Respiratory, Psychiatric, Allergic/Imm, Heme/Lymph   Last edited by Celine Mans, COA on 12/07/2020  8:42 AM. (History)       ALLERGIES Allergies  Allergen Reactions  . Other     Opiates; becomes hyperactive  . Sulfa Antibiotics     PAST MEDICAL HISTORY Past Medical History:  Diagnosis Date  . Cataract    Mixed form OU   Past Surgical History:  Procedure Laterality Date  . CESAREAN SECTION    . FOOT SURGERY     x2  . TONSILLECTOMY      FAMILY HISTORY History reviewed. No pertinent family history.  SOCIAL HISTORY Social History   Tobacco Use  . Smoking status: Current Some Day Smoker  . Smokeless tobacco: Never Used  Vaping Use  . Vaping Use: Never used  Substance Use Topics  . Alcohol use: Yes    Comment: occasional  . Drug use: No         OPHTHALMIC EXAM:  Base Eye Exam    Visual Acuity (Snellen - Linear)      Right Left   Dist cc 20/40 20/25   Dist ph cc 20/30 -2 NI   Correction: Glasses       Tonometry (Tonopen, 8:43 AM)      Right  Left   Pressure 18 17       Pupils      Dark Light Shape React APD   Right 4 3 Round Brisk None   Left 4 3 Round Brisk None       Visual Fields (Counting fingers)      Left Right    Full Full       Extraocular Movement      Right Left    Full, Ortho Full, Ortho       Neuro/Psych    Oriented x3: Yes   Mood/Affect: Normal       Dilation    Both eyes: 1.0% Mydriacyl, 2.5% Phenylephrine @ 8:43 AM        Refraction    Wearing Rx      Sphere Cylinder Axis Add   Right +1.75 +0.25 141 +2.75   Left +2.75 +0.75 001 +2.75   Age: 77yrs   Type: PAL       Manifest Refraction      Sphere Cylinder Axis Dist VA   Right +2.00 +0.50 100 20/30+2   Left +3.00 +1.00 015 20/25          IMAGING AND PROCEDURES  Imaging and Procedures for 12/07/2020  OCT, Retina -  OU - Both Eyes       Right Eye Quality was good. Progression has no prior data.   Left Eye Quality was good. Progression has no prior data.   Notes *Images captured and stored on drive  Diagnosis / Impression:    Clinical management:  See below  Abbreviations: NFP - Normal foveal profile. CME - cystoid macular edema. PED - pigment epithelial detachment. IRF - intraretinal fluid. SRF - subretinal fluid. EZ - ellipsoid zone. ERM - epiretinal membrane. ORA - outer retinal atrophy. ORT - outer retinal tubulation. SRHM - subretinal hyper-reflective material. IRHM - intraretinal hyper-reflective material                 ASSESSMENT/PLAN:    ICD-10-CM   1. Retinal edema  H35.81 OCT, Retina - OU - Both Eyes    1.  2.  3.  Ophthalmic Meds Ordered this visit:  No orders of the defined types were placed in this encounter.      No follow-ups on file.  There are no Patient Instructions on file for this visit.   Explained the diagnoses, plan, and follow up with the patient and they expressed understanding.  Patient expressed understanding of the importance of proper follow up care.   This document serves as a record of services personally performed by Karie Chimera, MD, PhD. It was created on their behalf by Joni Reining, an ophthalmic technician. The creation of this record is the provider's dictation and/or activities during the visit.    Electronically signed by: Joni Reining COA, 12/07/20  9:03 AM   This document serves as a record of services personally performed by Karie Chimera, MD, PhD. It was created on their behalf by Glee Arvin. Manson Passey, OA an ophthalmic technician. The creation of this record is the provider's dictation and/or activities during the visit.    Electronically signed by: Glee Arvin. Manson Passey, [place] 12.09.2021 9:03 AM    Karie Chimera, M.D., Ph.D. Diseases & Surgery of the Retina and Vitreous [facility]     Abbreviations: M myopia (nearsighted); A astigmatism; H hyperopia (farsighted); P presbyopia; Mrx spectacle prescription;  CTL contact lenses; OD right eye; OS left eye;  OU both eyes  XT exotropia; ET esotropia; PEK punctate epithelial keratitis; PEE punctate epithelial erosions; DES dry eye syndrome; MGD meibomian gland dysfunction; ATs artificial tears; PFAT's preservative free artificial tears; [place] nuclear sclerotic cataract; PSC posterior subcapsular cataract; ERM epi-retinal membrane; PVD posterior vitreous detachment; RD retinal detachment; DM diabetes mellitus; DR diabetic retinopathy; NPDR non-proliferative diabetic retinopathy; PDR proliferative diabetic retinopathy; CSME clinically significant macular edema; DME diabetic macular edema; dbh dot blot hemorrhages; CWS cotton wool spot; POAG primary open angle glaucoma; C/D cup-to-disc ratio; HVF humphrey visual field; GVF goldmann visual field; OCT optical coherence tomography; IOP intraocular pressure; BRVO Branch retinal vein occlusion; CRVO central retinal vein occlusion; CRAO central retinal artery occlusion; BRAO branch retinal artery occlusion; RT retinal tear; SB scleral buckle; PPV pars plana vitrectomy; VH Vitreous hemorrhage; PRP panretinal laser photocoagulation; IVK intravitreal kenalog; VMT vitreomacular traction; MH Macular hole;  NVD neovascularization of the disc; NVE neovascularization elsewhere; AREDS age related eye disease study; ARMD age related macular degeneration; POAG primary open angle glaucoma; EBMD epithelial/anterior basement membrane dystrophy; ACIOL anterior chamber intraocular lens; IOL intraocular lens; PCIOL posterior chamber intraocular lens; Phaco/IOL phacoemulsification with intraocular lens placement; [place] photorefractive keratectomy; LASIK laser assisted in situ keratomileusis; HTN hypertension; DM diabetes mellitus; COPD chronic obstructive pulmonary disease

## 2020-12-07 ENCOUNTER — Encounter (INDEPENDENT_AMBULATORY_CARE_PROVIDER_SITE_OTHER): Payer: Self-pay | Admitting: Ophthalmology

## 2020-12-07 ENCOUNTER — Other Ambulatory Visit: Payer: Self-pay

## 2020-12-07 ENCOUNTER — Ambulatory Visit (INDEPENDENT_AMBULATORY_CARE_PROVIDER_SITE_OTHER): Payer: Medicare Other | Admitting: Ophthalmology

## 2020-12-07 DIAGNOSIS — H25813 Combined forms of age-related cataract, bilateral: Secondary | ICD-10-CM | POA: Diagnosis not present

## 2020-12-07 DIAGNOSIS — H35033 Hypertensive retinopathy, bilateral: Secondary | ICD-10-CM | POA: Diagnosis not present

## 2020-12-07 DIAGNOSIS — H353211 Exudative age-related macular degeneration, right eye, with active choroidal neovascularization: Secondary | ICD-10-CM | POA: Diagnosis not present

## 2020-12-07 DIAGNOSIS — H353122 Nonexudative age-related macular degeneration, left eye, intermediate dry stage: Secondary | ICD-10-CM

## 2020-12-07 DIAGNOSIS — H3581 Retinal edema: Secondary | ICD-10-CM

## 2020-12-07 DIAGNOSIS — I10 Essential (primary) hypertension: Secondary | ICD-10-CM | POA: Diagnosis not present

## 2020-12-07 MED ORDER — BEVACIZUMAB CHEMO INJECTION 1.25MG/0.05ML SYRINGE FOR KALEIDOSCOPE
1.2500 mg | INTRAVITREAL | Status: AC | PRN
  Administered 2020-12-07: 1.25 mg via INTRAVITREAL

## 2020-12-07 NOTE — Progress Notes (Addendum)
[facility] - Clinic Note  12/07/2020     CHIEF COMPLAINT Patient presents for Retina Evaluation   HISTORY OF PRESENT ILLNESS: Michelle Drake is a 74 y.o. female who presents to the clinic today for:   HPI    Retina Evaluation    In right eye.  This started days ago.  Duration of days.  Context:  distance vision, mid-range vision and near vision.  Treatments tried include artificial tears.  Response to treatment was no improvement.  I, the attending physician,  performed the HPI with the patient and updated documentation appropriately.          Comments    74 y/o female pt referred by Dr. Alben Spittle on 12.6.21 for eval of CSR OD.  Pts vision OD has been gradually getting blurrier over the past several days.  No problems with vision OS.  Denies pain, FOL, floaters.  AT prn OU.       Last edited by Rennis Chris, MD on 12/07/2020  9:03 AM. (History)    pt here on the referral of Dr. Alben Spittle for concern of CSR OD, pt states she has been under an extreme amount of stress over the last year and especially the last month or so, pt lost her mother in law and 2 dogs, pt is very emotional, pt states she is a current smoker  Referring physician: Dimitri Ped, MD [address] [place],  [place] [address]  HISTORICAL INFORMATION:   Selected notes from the MEDICAL RECORD NUMBER Referred by Dr. Alben Spittle for concern of CSR v exu ARMD LEE:  Ocular Hx- PMH-    CURRENT MEDICATIONS: No current outpatient medications on file. (Ophthalmic Drugs)   No current facility-administered medications for this visit. (Ophthalmic Drugs)   Current Outpatient Medications (Other)  Medication Sig  . Multiple Vitamin (MULTIVITAMIN WITH MINERALS) TABS tablet Take 1 tablet by mouth daily.  . traMADol (ULTRAM) 50 MG tablet Take 1 tablet (50 mg total) by mouth every 6 (six) hours as needed.   No current facility-administered medications for this visit. (Other)      REVIEW OF  SYSTEMS: ROS    Positive for: Eyes   Negative for: Constitutional, Gastrointestinal, Neurological, Skin, Genitourinary, Musculoskeletal, HENT, Endocrine, Cardiovascular, Respiratory, Psychiatric, Allergic/Imm, Heme/Lymph   Last edited by Celine Mans, COA on 12/07/2020  8:42 AM. (History)       ALLERGIES Allergies  Allergen Reactions  . Other     Opiates; becomes hyperactive  . Sulfa Antibiotics     PAST MEDICAL HISTORY Past Medical History:  Diagnosis Date  . Cataract    Mixed form OU   Past Surgical History:  Procedure Laterality Date  . CESAREAN SECTION    . FOOT SURGERY     x2  . TONSILLECTOMY      FAMILY HISTORY History reviewed. No pertinent family history.  SOCIAL HISTORY Social History   Tobacco Use  . Smoking status: Current Some Day Smoker  . Smokeless tobacco: Never Used  Vaping Use  . Vaping Use: Never used  Substance Use Topics  . Alcohol use: Yes    Comment: occasional  . Drug use: No         OPHTHALMIC EXAM:  Base Eye Exam    Visual Acuity (Snellen - Linear)      Right Left   Dist cc 20/40 20/25   Dist ph cc 20/30 -2 NI   Correction: Glasses  Tonometry (Tonopen, 8:43 AM)      Right Left   Pressure 18 17       Pupils      Dark Light Shape React APD   Right 4 3 Round Brisk None   Left 4 3 Round Brisk None       Visual Fields (Counting fingers)      Left Right    Full Full       Extraocular Movement      Right Left    Full, Ortho Full, Ortho       Neuro/Psych    Oriented x3: Yes   Mood/Affect: Normal       Dilation    Both eyes: 1.0% Mydriacyl, 2.5% Phenylephrine @ 8:43 AM        Slit Lamp and Fundus Exam    Slit Lamp Exam      Right Left   Lids/Lashes Dermatochalasis - upper lid, Meibomian gland dysfunction Dermatochalasis - upper lid   Conjunctiva/Sclera White and quiet White and quiet   Cornea Punctate epithelial erosions Punctate epithelial erosions   Anterior Chamber deep, narrow temporal angle  deep, narrow temporal angle   Iris Round and dilated Round and dilated   Lens 2+ Nuclear sclerosis, 2-3+ Cortical cataract 2+ Nuclear sclerosis, 2-3+ Cortical cataract   Vitreous Vitreous syneresis Vitreous syneresis       Fundus Exam      Right Left   Disc Pink and Sharp Pink and Sharp   C/D Ratio 0.3 0.4   Macula Blunted foveal reflex, Drusen, CNV with central SRF / edema, RPE mottling Blunted foveal reflex, Drusen, RPE mottling and mild clumping   Vessels attenuated, mild tortuousity, AV crossing changes attenuated, mild tortuousity, AV crossing changes, mild Copper wiring   Periphery Attached, mild reticular degeneration Attached, mild reticular degeneration        Refraction    Wearing Rx      Sphere Cylinder Axis Add   Right +1.75 +0.25 141 +2.75   Left +2.75 +0.75 001 +2.75   Age: [age]yrs   Type: PAL       Manifest Refraction      Sphere Cylinder Axis Dist VA   Right +2.00 +0.50 100 20/30+2   Left +3.00 +1.00 015 20/25          IMAGING AND PROCEDURES  Imaging and Procedures for 12/07/2020  OCT, Retina - OU - Both Eyes       Right Eye Quality was good. Central Foveal Thickness: 406. Progression has no prior data. Findings include abnormal foveal contour, no IRF, subretinal fluid, pigment epithelial detachment, vitreomacular adhesion  (Focal CNV with overlying SRF centrally).   Left Eye Quality was good. Central Foveal Thickness: 289. Progression has no prior data. Findings include vitreomacular adhesion , retinal drusen , no IRF, normal foveal contour, no SRF.   Notes *Images captured and stored on drive  Diagnosis / Impression:  OD: exu ARMD -- focal CNV with overlying SRF centrally OS: non-exu ARMD -- mild  Clinical management:  See below  Abbreviations: NFP - Normal foveal profile. CME - cystoid macular edema. PED - pigment epithelial detachment. IRF - intraretinal fluid. SRF - subretinal fluid. EZ - ellipsoid zone. ERM - epiretinal membrane. ORA - outer  retinal atrophy. ORT - outer retinal tubulation. SRHM - subretinal hyper-reflective material. IRHM - intraretinal hyper-reflective material        Fluorescein Angiography Optos (Transit OD)       Right Eye   Progression has  no prior data. Early phase findings include staining, choroidal neovascularization, leakage. Mid/Late phase findings include staining, choroidal neovascularization, leakage (Focal CNV with leakage inferior macula).   Left Eye   Progression has no prior data. Early phase findings include normal observations. Mid/Late phase findings include staining.   Notes **Images stored on drive**  Impression: OD: Focal CNV with leakage inferior macula OS: normal study; mild staining         Intravitreal Injection, Pharmacologic Agent - OD - Right Eye       Time Out 12/07/2020. 11:18 AM. Confirmed correct patient, procedure, site, and patient consented.   Anesthesia Topical anesthesia was used. Anesthetic medications included Lidocaine 2%, Proparacaine 0.5%.   Procedure Preparation included 5% betadine to ocular surface, eyelid speculum. A (32g) needle was used.   Injection:  1.25 mg Bevacizumab (AVASTIN) 1.25mg /0.54mL SOLN   NDC: P3213405, Lot: [id number], Expiration date: 12/21/2020   Route: Intravitreal, Site: Right Eye, Waste: 0.05 mL  Post-op Post injection exam found visual acuity of at least counting fingers. The patient tolerated the procedure well. There were no complications. The patient received written and verbal post procedure care education.                 ASSESSMENT/PLAN:    ICD-10-CM   1. Exudative age-related macular degeneration of right eye with active choroidal neovascularization (HCC)  H35.3211 Intravitreal Injection, Pharmacologic Agent - OD - Right Eye    Bevacizumab (AVASTIN) SOLN 1.25 mg  2. Retinal edema  H35.81 OCT, Retina - OU - Both Eyes  3. Intermediate stage nonexudative age-related macular degeneration of left eye   H35.3122   4. Essential hypertension  I10   5. Hypertensive retinopathy of both eyes  H35.033 Fluorescein Angiography Optos (Transit OD)  6. Combined forms of age-related cataract of both eyes  H25.813     1,2. Exudative age related macular degeneration, OD  - pt reports acute decrease in vision OD over the last several days  - central CNV w/ overlying SRF  - The incidence pathology and anatomy of wet AMD discussed   - discussed treatment options including observation vs intravitreal anti-VEGF agents such as Avastin, Lucentis, Eylea.    - Risks of endophthalmitis and vascular occlusive events and atrophic changes discussed with patient  - OCT shows focal CNV with overlying SRF centrally  - FA (12.09.21) focal CNV with leakage inferior macula  - recommend IVA OD #1 today, 12.09.21 - pt wishes to be treated with IVA - RBA of procedure discussed, questions answered - IVA informed consent obtained and signed, 12.09.21 (OD) - see procedure note  - f/u in 4 wks -- DFE/OCT, possible injection  3. Age related macular degeneration, non-exudative, OS  - The incidence, anatomy, and pathology of dry AMD, risk of progression, and the AREDS and AREDS 2 studies including smoking risks discussed with patient.  - Recommend amsler grid monitoring  - f/u 3 months, DFE, OCT  4,5. Hypertensive retinopathy OU - discussed importance of tight BP control - monitor  6. Mixed Cataract OU - The symptoms of cataract, surgical options, and treatments and risks were discussed with patient. - discussed diagnosis and progression - not yet visually significant - monitor for now  Ophthalmic Meds Ordered this visit:  Meds ordered this encounter  Medications  . Bevacizumab (AVASTIN) SOLN 1.25 mg       Return in about 4 weeks (around 01/04/2021) for ex ARMD OD - Dilated Exam, OCT, Possible Injxn.  There are no Patient Instructions  on file for this visit.   Explained the diagnoses, plan, and follow up with  the patient and they expressed understanding.  Patient expressed understanding of the importance of proper follow up care.   This document serves as a record of services personally performed by Karie Chimera, MD, PhD. It was created on their behalf by Glee Arvin. Manson Passey, OA an ophthalmic technician. The creation of this record is the provider's dictation and/or activities during the visit.    Electronically signed by: Glee Arvin. Manson Passey, OA @TODAY @ 5:43 PM   , M.D., Ph.D. Diseases & Surgery of the Retina and Vitreous [facility]  I have reviewed the above documentation for accuracy and completeness, and I agree with the above. [facility], M.D., Ph.D. 12/07/20 5:47 PM   Abbreviations: M myopia (nearsighted); A astigmatism; H hyperopia (farsighted); P presbyopia; Mrx spectacle prescription;  CTL contact lenses; OD right eye; OS left eye; OU both eyes  XT exotropia; ET esotropia; PEK punctate epithelial keratitis; PEE punctate epithelial erosions; DES dry eye syndrome; MGD meibomian gland dysfunction; ATs artificial tears; PFAT's preservative free artificial tears; NSC nuclear sclerotic cataract; PSC posterior subcapsular cataract; ERM epi-retinal membrane; PVD posterior vitreous detachment; RD retinal detachment; DM diabetes mellitus; DR diabetic retinopathy; NPDR non-proliferative diabetic retinopathy; PDR proliferative diabetic retinopathy; CSME clinically significant macular edema; DME diabetic macular edema; dbh dot blot hemorrhages; CWS cotton wool spot; POAG primary open angle glaucoma; C/D cup-to-disc ratio; HVF humphrey visual field; GVF goldmann visual field; OCT optical coherence tomography; IOP intraocular pressure; BRVO Branch retinal vein occlusion; CRVO central retinal vein occlusion; CRAO central retinal artery occlusion; BRAO branch retinal artery occlusion; RT retinal tear; SB scleral buckle; PPV pars plana vitrectomy; VH Vitreous hemorrhage; PRP  panretinal laser photocoagulation; IVK intravitreal kenalog; VMT vitreomacular traction; MH Macular hole;  NVD neovascularization of the disc; NVE neovascularization elsewhere; AREDS age related eye disease study; ARMD age related macular degeneration; POAG primary open angle glaucoma; EBMD epithelial/anterior basement membrane dystrophy; ACIOL anterior chamber intraocular lens; IOL intraocular lens; PCIOL posterior chamber intraocular lens; Phaco/IOL phacoemulsification with intraocular lens placement; PRK photorefractive keratectomy; LASIK laser assisted in situ keratomileusis; HTN hypertension; DM diabetes mellitus; COPD chronic obstructive pulmonary disease

## 2021-01-09 ENCOUNTER — Encounter (INDEPENDENT_AMBULATORY_CARE_PROVIDER_SITE_OTHER): Payer: Medicare Other | Admitting: Ophthalmology

## 2021-01-23 DIAGNOSIS — H2513 Age-related nuclear cataract, bilateral: Secondary | ICD-10-CM | POA: Diagnosis not present

## 2021-01-23 DIAGNOSIS — H353121 Nonexudative age-related macular degeneration, left eye, early dry stage: Secondary | ICD-10-CM | POA: Diagnosis not present

## 2021-01-23 DIAGNOSIS — H353221 Exudative age-related macular degeneration, left eye, with active choroidal neovascularization: Secondary | ICD-10-CM | POA: Diagnosis not present

## 2021-01-25 DIAGNOSIS — F4323 Adjustment disorder with mixed anxiety and depressed mood: Secondary | ICD-10-CM | POA: Diagnosis not present

## 2021-02-01 DIAGNOSIS — F4323 Adjustment disorder with mixed anxiety and depressed mood: Secondary | ICD-10-CM | POA: Diagnosis not present

## 2021-02-06 DIAGNOSIS — H353211 Exudative age-related macular degeneration, right eye, with active choroidal neovascularization: Secondary | ICD-10-CM | POA: Diagnosis not present

## 2021-02-08 DIAGNOSIS — F4323 Adjustment disorder with mixed anxiety and depressed mood: Secondary | ICD-10-CM | POA: Diagnosis not present

## 2021-02-09 DIAGNOSIS — H5711 Ocular pain, right eye: Secondary | ICD-10-CM | POA: Diagnosis not present

## 2021-02-13 DIAGNOSIS — F4323 Adjustment disorder with mixed anxiety and depressed mood: Secondary | ICD-10-CM | POA: Diagnosis not present

## 2021-02-21 DIAGNOSIS — F418 Other specified anxiety disorders: Secondary | ICD-10-CM | POA: Diagnosis not present

## 2021-02-22 DIAGNOSIS — F4323 Adjustment disorder with mixed anxiety and depressed mood: Secondary | ICD-10-CM | POA: Diagnosis not present

## 2021-03-01 DIAGNOSIS — F4323 Adjustment disorder with mixed anxiety and depressed mood: Secondary | ICD-10-CM | POA: Diagnosis not present

## 2021-03-06 DIAGNOSIS — H353121 Nonexudative age-related macular degeneration, left eye, early dry stage: Secondary | ICD-10-CM | POA: Diagnosis not present

## 2021-03-06 DIAGNOSIS — H353211 Exudative age-related macular degeneration, right eye, with active choroidal neovascularization: Secondary | ICD-10-CM | POA: Diagnosis not present

## 2021-03-08 DIAGNOSIS — F4323 Adjustment disorder with mixed anxiety and depressed mood: Secondary | ICD-10-CM | POA: Diagnosis not present

## 2021-03-22 DIAGNOSIS — F4323 Adjustment disorder with mixed anxiety and depressed mood: Secondary | ICD-10-CM | POA: Diagnosis not present

## 2021-04-04 DIAGNOSIS — H353121 Nonexudative age-related macular degeneration, left eye, early dry stage: Secondary | ICD-10-CM | POA: Diagnosis not present

## 2021-04-04 DIAGNOSIS — H2513 Age-related nuclear cataract, bilateral: Secondary | ICD-10-CM | POA: Diagnosis not present

## 2021-04-04 DIAGNOSIS — H353211 Exudative age-related macular degeneration, right eye, with active choroidal neovascularization: Secondary | ICD-10-CM | POA: Diagnosis not present

## 2021-04-11 DIAGNOSIS — F4323 Adjustment disorder with mixed anxiety and depressed mood: Secondary | ICD-10-CM | POA: Diagnosis not present

## 2021-04-26 DIAGNOSIS — F4323 Adjustment disorder with mixed anxiety and depressed mood: Secondary | ICD-10-CM | POA: Diagnosis not present

## 2021-05-10 DIAGNOSIS — F4323 Adjustment disorder with mixed anxiety and depressed mood: Secondary | ICD-10-CM | POA: Diagnosis not present

## 2021-05-22 DIAGNOSIS — H2513 Age-related nuclear cataract, bilateral: Secondary | ICD-10-CM | POA: Diagnosis not present

## 2021-05-22 DIAGNOSIS — H43822 Vitreomacular adhesion, left eye: Secondary | ICD-10-CM | POA: Diagnosis not present

## 2021-05-22 DIAGNOSIS — H353211 Exudative age-related macular degeneration, right eye, with active choroidal neovascularization: Secondary | ICD-10-CM | POA: Diagnosis not present

## 2021-05-22 DIAGNOSIS — H353121 Nonexudative age-related macular degeneration, left eye, early dry stage: Secondary | ICD-10-CM | POA: Diagnosis not present

## 2021-05-25 DIAGNOSIS — F4323 Adjustment disorder with mixed anxiety and depressed mood: Secondary | ICD-10-CM | POA: Diagnosis not present

## 2021-06-07 DIAGNOSIS — F4323 Adjustment disorder with mixed anxiety and depressed mood: Secondary | ICD-10-CM | POA: Diagnosis not present

## 2021-07-10 DIAGNOSIS — H353211 Exudative age-related macular degeneration, right eye, with active choroidal neovascularization: Secondary | ICD-10-CM | POA: Diagnosis not present

## 2021-07-10 DIAGNOSIS — H43822 Vitreomacular adhesion, left eye: Secondary | ICD-10-CM | POA: Diagnosis not present

## 2021-07-10 DIAGNOSIS — H353121 Nonexudative age-related macular degeneration, left eye, early dry stage: Secondary | ICD-10-CM | POA: Diagnosis not present

## 2021-09-05 DIAGNOSIS — H353211 Exudative age-related macular degeneration, right eye, with active choroidal neovascularization: Secondary | ICD-10-CM | POA: Diagnosis not present

## 2021-09-05 DIAGNOSIS — H43822 Vitreomacular adhesion, left eye: Secondary | ICD-10-CM | POA: Diagnosis not present

## 2021-09-05 DIAGNOSIS — H353122 Nonexudative age-related macular degeneration, left eye, intermediate dry stage: Secondary | ICD-10-CM | POA: Diagnosis not present

## 2021-10-31 DIAGNOSIS — H353122 Nonexudative age-related macular degeneration, left eye, intermediate dry stage: Secondary | ICD-10-CM | POA: Diagnosis not present

## 2021-10-31 DIAGNOSIS — H43822 Vitreomacular adhesion, left eye: Secondary | ICD-10-CM | POA: Diagnosis not present

## 2021-10-31 DIAGNOSIS — H2513 Age-related nuclear cataract, bilateral: Secondary | ICD-10-CM | POA: Diagnosis not present

## 2021-10-31 DIAGNOSIS — H353211 Exudative age-related macular degeneration, right eye, with active choroidal neovascularization: Secondary | ICD-10-CM | POA: Diagnosis not present

## 2021-12-26 DIAGNOSIS — H43822 Vitreomacular adhesion, left eye: Secondary | ICD-10-CM | POA: Diagnosis not present

## 2021-12-26 DIAGNOSIS — H2513 Age-related nuclear cataract, bilateral: Secondary | ICD-10-CM | POA: Diagnosis not present

## 2021-12-26 DIAGNOSIS — H353122 Nonexudative age-related macular degeneration, left eye, intermediate dry stage: Secondary | ICD-10-CM | POA: Diagnosis not present

## 2021-12-26 DIAGNOSIS — H353211 Exudative age-related macular degeneration, right eye, with active choroidal neovascularization: Secondary | ICD-10-CM | POA: Diagnosis not present

## 2022-02-20 DIAGNOSIS — H43822 Vitreomacular adhesion, left eye: Secondary | ICD-10-CM | POA: Diagnosis not present

## 2022-02-20 DIAGNOSIS — H353211 Exudative age-related macular degeneration, right eye, with active choroidal neovascularization: Secondary | ICD-10-CM | POA: Diagnosis not present

## 2022-02-20 DIAGNOSIS — H353122 Nonexudative age-related macular degeneration, left eye, intermediate dry stage: Secondary | ICD-10-CM | POA: Diagnosis not present

## 2022-02-20 DIAGNOSIS — H2513 Age-related nuclear cataract, bilateral: Secondary | ICD-10-CM | POA: Diagnosis not present

## 2022-03-29 DIAGNOSIS — H3540 Unspecified peripheral retinal degeneration: Secondary | ICD-10-CM | POA: Diagnosis not present

## 2022-03-29 DIAGNOSIS — H353211 Exudative age-related macular degeneration, right eye, with active choroidal neovascularization: Secondary | ICD-10-CM | POA: Diagnosis not present

## 2022-03-29 DIAGNOSIS — H43822 Vitreomacular adhesion, left eye: Secondary | ICD-10-CM | POA: Diagnosis not present

## 2022-03-29 DIAGNOSIS — H353122 Nonexudative age-related macular degeneration, left eye, intermediate dry stage: Secondary | ICD-10-CM | POA: Diagnosis not present

## 2022-05-17 DIAGNOSIS — H2513 Age-related nuclear cataract, bilateral: Secondary | ICD-10-CM | POA: Diagnosis not present

## 2022-05-17 DIAGNOSIS — H353211 Exudative age-related macular degeneration, right eye, with active choroidal neovascularization: Secondary | ICD-10-CM | POA: Diagnosis not present

## 2022-05-17 DIAGNOSIS — H353122 Nonexudative age-related macular degeneration, left eye, intermediate dry stage: Secondary | ICD-10-CM | POA: Diagnosis not present

## 2022-05-17 DIAGNOSIS — H43822 Vitreomacular adhesion, left eye: Secondary | ICD-10-CM | POA: Diagnosis not present

## 2022-07-12 DIAGNOSIS — H3540 Unspecified peripheral retinal degeneration: Secondary | ICD-10-CM | POA: Diagnosis not present

## 2022-07-12 DIAGNOSIS — H43822 Vitreomacular adhesion, left eye: Secondary | ICD-10-CM | POA: Diagnosis not present

## 2022-07-12 DIAGNOSIS — H353121 Nonexudative age-related macular degeneration, left eye, early dry stage: Secondary | ICD-10-CM | POA: Diagnosis not present

## 2022-07-12 DIAGNOSIS — H353211 Exudative age-related macular degeneration, right eye, with active choroidal neovascularization: Secondary | ICD-10-CM | POA: Diagnosis not present

## 2022-09-18 DIAGNOSIS — H43822 Vitreomacular adhesion, left eye: Secondary | ICD-10-CM | POA: Diagnosis not present

## 2022-09-18 DIAGNOSIS — H353122 Nonexudative age-related macular degeneration, left eye, intermediate dry stage: Secondary | ICD-10-CM | POA: Diagnosis not present

## 2022-09-18 DIAGNOSIS — H3540 Unspecified peripheral retinal degeneration: Secondary | ICD-10-CM | POA: Diagnosis not present

## 2022-09-18 DIAGNOSIS — H353211 Exudative age-related macular degeneration, right eye, with active choroidal neovascularization: Secondary | ICD-10-CM | POA: Diagnosis not present

## 2022-10-09 DIAGNOSIS — E663 Overweight: Secondary | ICD-10-CM | POA: Diagnosis not present

## 2022-10-09 DIAGNOSIS — E119 Type 2 diabetes mellitus without complications: Secondary | ICD-10-CM | POA: Diagnosis not present

## 2022-10-09 DIAGNOSIS — Z0000 Encounter for general adult medical examination without abnormal findings: Secondary | ICD-10-CM | POA: Diagnosis not present

## 2022-10-09 DIAGNOSIS — F418 Other specified anxiety disorders: Secondary | ICD-10-CM | POA: Diagnosis not present

## 2022-10-09 DIAGNOSIS — R7309 Other abnormal glucose: Secondary | ICD-10-CM | POA: Diagnosis not present

## 2022-10-09 DIAGNOSIS — Z1331 Encounter for screening for depression: Secondary | ICD-10-CM | POA: Diagnosis not present

## 2022-10-09 DIAGNOSIS — Z6828 Body mass index (BMI) 28.0-28.9, adult: Secondary | ICD-10-CM | POA: Diagnosis not present

## 2022-11-14 DIAGNOSIS — H353122 Nonexudative age-related macular degeneration, left eye, intermediate dry stage: Secondary | ICD-10-CM | POA: Diagnosis not present

## 2022-11-14 DIAGNOSIS — H353211 Exudative age-related macular degeneration, right eye, with active choroidal neovascularization: Secondary | ICD-10-CM | POA: Diagnosis not present

## 2022-12-19 DIAGNOSIS — U071 COVID-19: Secondary | ICD-10-CM | POA: Diagnosis not present

## 2023-01-14 DIAGNOSIS — H43822 Vitreomacular adhesion, left eye: Secondary | ICD-10-CM | POA: Diagnosis not present

## 2023-01-14 DIAGNOSIS — H353122 Nonexudative age-related macular degeneration, left eye, intermediate dry stage: Secondary | ICD-10-CM | POA: Diagnosis not present

## 2023-01-14 DIAGNOSIS — H353211 Exudative age-related macular degeneration, right eye, with active choroidal neovascularization: Secondary | ICD-10-CM | POA: Diagnosis not present

## 2023-03-12 DIAGNOSIS — H43822 Vitreomacular adhesion, left eye: Secondary | ICD-10-CM | POA: Diagnosis not present

## 2023-03-12 DIAGNOSIS — H353211 Exudative age-related macular degeneration, right eye, with active choroidal neovascularization: Secondary | ICD-10-CM | POA: Diagnosis not present

## 2023-03-12 DIAGNOSIS — H353122 Nonexudative age-related macular degeneration, left eye, intermediate dry stage: Secondary | ICD-10-CM | POA: Diagnosis not present

## 2023-04-29 ENCOUNTER — Ambulatory Visit
Admission: EM | Admit: 2023-04-29 | Discharge: 2023-04-29 | Disposition: A | Discharge status: Home / Self Care | Payer: Medicare Other | Attending: Family Medicine | Admitting: Family Medicine

## 2023-04-29 DIAGNOSIS — R238 Other skin changes: Secondary | ICD-10-CM | POA: Diagnosis not present

## 2023-04-29 DIAGNOSIS — W57XXXA Bitten or stung by nonvenomous insect and other nonvenomous arthropods, initial encounter: Secondary | ICD-10-CM

## 2023-04-29 DIAGNOSIS — S50862A Insect bite (nonvenomous) of left forearm, initial encounter: Secondary | ICD-10-CM | POA: Diagnosis not present

## 2023-04-29 MED ORDER — TRIAMCINOLONE ACETONIDE 0.1 % EX CREA: 1.0000 | TOPICAL_CREAM | Freq: Two times a day (BID) | CUTANEOUS | 0 refills | Status: AC

## 2023-04-29 NOTE — ED Provider Notes (Signed)
  [facility]   [id number] 04/29/23 Arrival Time: 1317  ASSESSMENT & PLAN:  1. Skin irritation   2. Tick bite of left forearm, initial encounter    Non-engorged tick removed without complication by forceps grasping and twisting. No retained parts. No sign of infection. Precautions to monitor for flu-like symptoms or fever along with any new rashes. Should these develop she will seek follow up. For itching: Meds ordered this encounter  Medications   triamcinolone cream (KENALOG) 0.1 %    Sig: Apply 1 Application topically 2 (two) times daily.    Dispense:  15 g    Refill:  0     Follow-up Information     [facility], [employer].   Specialty: Family Medicine Why: As needed. Contact information: [address] [place] [address] [phone]                 Reviewed expectations re: course of current medical issues. Questions answered. Outlined signs and symptoms indicating need for more acute intervention. Patient verbalized understanding. After Visit Summary given.   SUBJECTIVE: History from: patient. Michelle Drake is a 77 y.o. female who reports finding a tick on her LEFT forearm today. Here for removal. Skin itching.    OBJECTIVE:  Vitals:   04/29/23 1330  BP: 130/84  Pulse: 88  Resp: 18  Temp: 98.3 F (36.8 C)  TempSrc: Oral  SpO2: 96%    General appearance: alert; no distress Extremities: no edema; symmetrical with no gross deformities Skin: warm and dry; non-engorged tick on left forearm; slight skin erythema Neurologic: normal gait; normal symmetric reflexes Psychological: alert and cooperative; normal mood and affect  Allergies  Allergen Reactions   Other     Opiates; becomes hyperactive   Sulfa Antibiotics     Past Medical History:  Diagnosis Date   Cataract    Mixed form OU   Social History   Socioeconomic History   Marital status: Married    Spouse name: Not on file   Number of children:  Not on file   Years of education: Not on file   Highest education level: Not on file  Occupational History   Not on file  Tobacco Use   Smoking status: Some Days   Smokeless tobacco: Never  Vaping Use   Vaping Use: Never used  Substance and Sexual Activity   Alcohol use: Yes    Comment: occasional   Drug use: No   Sexual activity: Not on file  Other Topics Concern   Not on file  Social History Narrative   Not on file   Social Determinants of Health   Financial Resource Strain: Not on file  Food Insecurity: Not on file  Transportation Needs: Not on file  Physical Activity: Not on file  Stress: Not on file  Social Connections: Not on file  Intimate Partner Violence: Not on file   History reviewed. No pertinent family history. Past Surgical History:  Procedure Laterality Date   CESAREAN SECTION     FOOT SURGERY     x2   TONSILLECTOMY        Mardella Layman, MD 04/29/23 [phone]

## 2023-04-29 NOTE — ED Triage Notes (Signed)
Pt presents with tick that needs to be removed on left forearm today.

## 2023-04-29 NOTE — Discharge Instructions (Signed)
You have been bitten by a tick. Most often, no illness will result though, rarely, a tick bite may cause  °disease in humans that include: °1) local infection °2) [place] spotted fever (RMSF) °3) Lyme disease. ° °Local (at the bite site) infections are usually caused by germs (bacteria) normally present on the skin that enter the germ-free layers beneath the skin. Any cut, scrape, bite, or break in the skin can result in  local infection.  °RMSF is a whole-body infection caused by bacteria carried by ticks. Bacteria enter the body when a tick bites a human to obtain a meal of blood. Very few tick bites, perhaps 1 in 100, spread this  potentially very serious illness. °Lyme disease is also a whole-body infection spread by tick bites. Lyme disease can be serious, but symptoms usually come on more slowly than RMSF. ° °Expect some redness and swelling at the bite site that may persist for several days up to a few weeks. ° °The incubation period (time between tick bite and start of RMSF) is between 2 to 14 days (average 7 days). °Symptoms of RMSF include: fever, chills, headache, stiff neck, muscle aches, and skin rash. Some or all of these symptoms may be present. Should you develop any of these symptoms after a tick bite you must see a healthcare provider as soon as possible.  ° °If you develop an odd-looking rash near the site of a tick bite with or without mild, nonspecific flu-like symptoms, see a healthcare provider as soon as possible. The diagnosis of Lyme disease can be considered and antibiotic treatment started if indicated.  ° °You may try an over-the-counter antihistamine such as a non-drowsy one like loratadine (Claritin) or one that might make you sleepy like diphenhydramine (Benadryl). These medicines may help relieve itching, redness, and swelling. Take as directed on the packaging. ° °You may also use a spray of local anesthetic that contains benzocaine, such as Solarcaine. This may help relieve  mild pain. If your skin reacts to the spray, stop using it immediately. °Calamine lotion on the skin may also help relieve itching. ° °

## 2023-05-14 DIAGNOSIS — H43822 Vitreomacular adhesion, left eye: Secondary | ICD-10-CM | POA: Diagnosis not present

## 2023-05-14 DIAGNOSIS — H353211 Exudative age-related macular degeneration, right eye, with active choroidal neovascularization: Secondary | ICD-10-CM | POA: Diagnosis not present

## 2023-05-14 DIAGNOSIS — H353122 Nonexudative age-related macular degeneration, left eye, intermediate dry stage: Secondary | ICD-10-CM | POA: Diagnosis not present

## 2023-05-22 ENCOUNTER — Other Ambulatory Visit: Payer: Self-pay

## 2023-05-22 ENCOUNTER — Ambulatory Visit
Admission: EM | Admit: 2023-05-22 | Discharge: 2023-05-22 | Disposition: A | Discharge status: Home / Self Care | Payer: Medicare Other | Attending: Nurse Practitioner | Admitting: Nurse Practitioner

## 2023-05-22 ENCOUNTER — Encounter: Payer: Self-pay | Admitting: Emergency Medicine

## 2023-05-22 DIAGNOSIS — Z23 Encounter for immunization: Secondary | ICD-10-CM | POA: Diagnosis not present

## 2023-05-22 DIAGNOSIS — W540XXA Bitten by dog, initial encounter: Secondary | ICD-10-CM

## 2023-05-22 DIAGNOSIS — S40022A Contusion of left upper arm, initial encounter: Secondary | ICD-10-CM | POA: Diagnosis not present

## 2023-05-22 MED ORDER — AMOXICILLIN-POT CLAVULANATE 875-125 MG PO TABS: 1.0000 | ORAL_TABLET | Freq: Two times a day (BID) | ORAL | 0 refills | Status: AC

## 2023-05-22 MED ORDER — TETANUS-DIPHTH-ACELL PERTUSSIS 5-2.5-18.5 LF-MCG/0.5 IM SUSY
0.5000 mL | PREFILLED_SYRINGE | Freq: Once | INTRAMUSCULAR | Status: AC
  Administered 2023-05-22: 0.5 mL via INTRAMUSCULAR

## 2023-05-22 NOTE — ED Notes (Signed)
Site cleaned with wound cleanser. Bacitracin,nonadherent dressing, secured with coban. Pt tolerated well. Site management and infection prevention education reviewed and pt and pt family verbalized understanding.

## 2023-05-22 NOTE — ED Triage Notes (Signed)
Pt reports startled a full grown dog that was sleeping. Pt reports dog is up to date on rabies vaccines.   Three puncture sites noted to left forearm/wrist. Moderate bruise noted to left medial wrist.

## 2023-05-22 NOTE — Discharge Instructions (Addendum)
Please keep the bites on your arm clean and dry.  You can use mild soap and water to clean them twice daily.  Start the Augmentin to prevent infection in the dog bites.  You can use ice or cool compresses to help with pain and swelling in the arm.  Tetanus shot has been updated today.  We decided not to start the rabies vaccine for you since the dog who bit you has been vaccinated.  Please follow-up with [place] if you develop signs or symptoms of infection in your left forearm to include fever, redness, swelling, drainage from the bite, red streaking up your arm.

## 2023-05-22 NOTE — ED Provider Notes (Signed)
[facility]    CSN: [id number] Arrival date & time: 05/22/23  1444      History   Chief Complaint Chief Complaint  Patient presents with   Animal Bite    HPI Michelle Drake is a 77 y.o. female.   Patient presents today with 3 puncture wounds to left forearm.  She reports a dog bit her and is not willing to tell me if it is a personal dog if it is a [employer] data [employer].  She reports she is confident that the dog is up-to-date on rabies vaccine.  Reports the dog is older and she woke him up from sleep which she knows she should not do.  She is concerned because the areas where the dog bit are very swollen and discolored.  She denies decreased sensation in her arm or decreased range of motion or strength.  No fever or nausea/vomiting since the dog bites occurred.   Unknown last Tdap.    Past Medical History:  Diagnosis Date   Cataract    Mixed form OU    There are no problems to display for this patient.   Past Surgical History:  Procedure Laterality Date   CESAREAN SECTION     FOOT SURGERY     x2   TONSILLECTOMY      OB History   No obstetric history on file.      Home Medications    Prior to Admission medications   Medication Sig Start Date End Date Taking? Authorizing Provider  amoxicillin-clavulanate (AUGMENTIN) 875-125 MG tablet Take 1 tablet by mouth 2 (two) times daily for 7 days. 05/22/23 05/29/23 Yes Cathlean Marseilles A, NP  triamcinolone cream (KENALOG) 0.1 % Apply 1 Application topically 2 (two) times daily. 04/29/23  Yes Mardella Layman, MD  Multiple Vitamin (MULTIVITAMIN WITH MINERALS) TABS tablet Take 1 tablet by mouth daily.    [provider]    Family History History reviewed. No pertinent family history.  Social History Social History   Tobacco Use   Smoking status: Some Days    Packs/day: .5    Types: Cigarettes   Smokeless tobacco: Never  Vaping Use   Vaping Use: Never used  Substance Use Topics   Alcohol use: Yes     Comment: occasional   Drug use: No     Allergies   Other and Sulfa antibiotics   Review of Systems Review of Systems Per HPI  Physical Exam Triage Vital Signs ED Triage Vitals [05/22/23 1502]  Enc Vitals Group     BP 129/85     Pulse Rate 88     Resp 20     Temp 98.9 F (37.2 C)     Temp Source Oral     SpO2 92 %     Weight      Height      Head Circumference      Peak Flow      Pain Score 4     Pain Loc      Pain Edu?      Excl. in GC?    No data found.  Updated Vital Signs BP 129/85 (BP Location: Right Arm)   Pulse 88   Temp 98.9 F (37.2 C) (Oral)   Resp 20   SpO2 92%   Visual Acuity Right Eye Distance:   Left Eye Distance:   Bilateral Distance:    Right Eye Near:   Left Eye Near:    Bilateral Near:  Physical Exam Vitals and nursing note reviewed.  Constitutional:      General: She is not in acute distress.    Appearance: Normal appearance. She is not toxic-appearing.  HENT:     Mouth/Throat:     Mouth: Mucous membranes are moist.     Pharynx: Oropharynx is clear.  Pulmonary:     Effort: Pulmonary effort is normal. No respiratory distress.  Skin:    General: Skin is warm and dry.     Capillary Refill: Capillary refill takes less than 2 seconds.     Findings: Abrasion and ecchymosis present.     Comments: 3 puncture wounds to left forearm.  There is surrounding bruising and dried blood noted to the puncture wounds.  No active drainage, redness, or warmth.  Neurological:     Mental Status: She is alert and oriented to person, place, and time.  Psychiatric:        Behavior: Behavior is cooperative.      UC Treatments / Results  Labs (all labs ordered are listed, but only abnormal results are displayed) Labs Reviewed - No data to display  EKG   Radiology No results found.  Procedures Procedures (including critical care time)  Medications Ordered in UC Medications  Tdap (BOOSTRIX) injection 0.5 mL (0.5 mLs Intramuscular  Given 05/22/23 1538)    Initial Impression / Assessment and Plan / UC Course  I have reviewed the triage vital signs and the nursing notes.  Pertinent labs & imaging results that were available during my care of the patient were reviewed by me and considered in my medical decision making (see chart for details).   Patient is well-appearing, normotensive, afebrile, not tachycardic, not tachypneic, oxygenating well on room air.    1. Dog bite, initial encounter 2. Hematoma of arm, left, initial encounter Rabies vaccine deferred since patient is competent the dog has been vaccinated Wound care and supportive care discussed for puncture wounds Wound care applied today Start Augmentin to prevent infection Tdap updated Strict ER and return precautions discussed with patient  The patient was given the opportunity to ask questions.  All questions answered to their satisfaction.  The patient is in agreement to this plan.    Final Clinical Impressions(s) / UC Diagnoses   Final diagnoses:  Dog bite, initial encounter  Hematoma of arm, left, initial encounter     Discharge Instructions      Please keep the bites on your arm clean and dry.  You can use mild soap and water to clean them twice daily.  Start the Augmentin to prevent infection in the dog bites.  You can use ice or cool compresses to help with pain and swelling in the arm.  Tetanus shot has been updated today.  We decided not to start the rabies vaccine for you since the dog who bit you has been vaccinated.  Please follow-up with [place] if you develop signs or symptoms of infection in your left forearm to include fever, redness, swelling, drainage from the bite, red streaking up your arm.   ED Prescriptions     Medication Sig Dispense Auth. Provider   amoxicillin-clavulanate (AUGMENTIN) 875-125 MG tablet Take 1 tablet by mouth 2 (two) times daily for 7 days. 14 tablet Valentino Nose, NP      PDMP not reviewed this  encounter.   Valentino Nose, NP 05/22/23 [phone]

## 2023-05-22 NOTE — ED Notes (Signed)
Patient access notifying law enforcement.Pt aware.

## 2023-07-23 DIAGNOSIS — H43822 Vitreomacular adhesion, left eye: Secondary | ICD-10-CM | POA: Diagnosis not present

## 2023-07-23 DIAGNOSIS — H353122 Nonexudative age-related macular degeneration, left eye, intermediate dry stage: Secondary | ICD-10-CM | POA: Diagnosis not present

## 2023-07-23 DIAGNOSIS — H353211 Exudative age-related macular degeneration, right eye, with active choroidal neovascularization: Secondary | ICD-10-CM | POA: Diagnosis not present

## 2023-08-15 ENCOUNTER — Emergency Department (HOSPITAL_COMMUNITY)
Admission: EM | Admit: 2023-08-15 | Discharge: 2023-08-15 | Disposition: A | Discharge status: Home / Self Care | Payer: Medicare Other | Source: Home / Self Care | Attending: Emergency Medicine | Admitting: Emergency Medicine

## 2023-08-15 ENCOUNTER — Other Ambulatory Visit: Payer: Self-pay

## 2023-08-15 ENCOUNTER — Emergency Department (HOSPITAL_COMMUNITY): Payer: Medicare Other

## 2023-08-15 DIAGNOSIS — S63591A Other specified sprain of right wrist, initial encounter: Secondary | ICD-10-CM

## 2023-08-15 DIAGNOSIS — M25531 Pain in right wrist: Secondary | ICD-10-CM | POA: Diagnosis present

## 2023-08-15 DIAGNOSIS — R600 Localized edema: Secondary | ICD-10-CM | POA: Diagnosis not present

## 2023-08-15 DIAGNOSIS — W010XXA Fall on same level from slipping, tripping and stumbling without subsequent striking against object, initial encounter: Secondary | ICD-10-CM | POA: Diagnosis not present

## 2023-08-15 DIAGNOSIS — S52614A Nondisplaced fracture of right ulna styloid process, initial encounter for closed fracture: Secondary | ICD-10-CM | POA: Insufficient documentation

## 2023-08-15 DIAGNOSIS — S52611A Displaced fracture of right ulna styloid process, initial encounter for closed fracture: Secondary | ICD-10-CM | POA: Diagnosis not present

## 2023-08-15 DIAGNOSIS — S63391A Traumatic rupture of other ligament of right wrist, initial encounter: Secondary | ICD-10-CM | POA: Insufficient documentation

## 2023-08-15 DIAGNOSIS — S52614S Nondisplaced fracture of right ulna styloid process, sequela: Secondary | ICD-10-CM

## 2023-08-15 DIAGNOSIS — M19031 Primary osteoarthritis, right wrist: Secondary | ICD-10-CM | POA: Diagnosis not present

## 2023-08-15 DIAGNOSIS — S63501A Unspecified sprain of right wrist, initial encounter: Secondary | ICD-10-CM

## 2023-08-15 NOTE — Discharge Instructions (Addendum)
Please use Tylenol or ibuprofen for pain.  You may use 600 mg ibuprofen every 6 hours or 1000 mg of Tylenol every 6 hours.  You may choose to alternate between the 2.  This would be most effective.  Not to exceed 4 g of Tylenol within 24 hours.  Not to exceed 3200 mg ibuprofen 24 hours.  I recommend rest, ice, compression, elevation of the affected extremity.  Keep the splint clean, dry, and follow up with orthopedics next week for further evaluation and management.

## 2023-08-15 NOTE — ED Notes (Signed)
Pt at bedside with family

## 2023-08-15 NOTE — ED Triage Notes (Signed)
Pt slipped and fell yesterday and landed on right wrist. No other injuries. No LOC

## 2023-08-15 NOTE — ED Notes (Addendum)
Pt states, " Yesterday I slipped walking into my house. I have not taken any pain medications today. My wrist hurts when I Move it. "  Pt wrist is red and swollen

## 2023-08-15 NOTE — ED Provider Notes (Signed)
[place] [facility] AT [facility] Provider Note   CSN: [id number] Arrival date & time: 08/15/23  1211     History  Chief Complaint  Patient presents with   Wrist Pain    Michelle Drake is a 77 y.o. female with overall noncontributory past medical history who presents with concern for fall and injury of right wrist yesterday after FOOSH.  She denies any other injury, she denies head injury, loss of conscious.  She rates her pain 4/10 at this time.  She did not take anything for pain prior to arrival.   Wrist Pain       Home Medications Prior to Admission medications   Medication Sig Start Date End Date Taking? Authorizing Provider  Multiple Vitamin (MULTIVITAMIN WITH MINERALS) TABS tablet Take 1 tablet by mouth daily.    [provider]  triamcinolone cream (KENALOG) 0.1 % Apply 1 Application topically 2 (two) times daily. 04/29/23   Mardella Layman, MD      Allergies    Other and Sulfa antibiotics    Review of Systems   Review of Systems  All other systems reviewed and are negative.   Physical Exam Updated Vital Signs BP (!) 148/64 (BP Location: Left Arm)   Pulse 78   Temp 97.8 F (36.6 C) (Oral)   Resp 16   Ht 5\' 1"  (1.549 m)   Wt 68 kg   SpO2 96%   BMI 28.34 kg/m  Physical Exam Vitals and nursing note reviewed.  Constitutional:      General: She is not in acute distress.    Appearance: Normal appearance.  HENT:     Head: Normocephalic and atraumatic.  Eyes:     General:        Right eye: No discharge.        Left eye: No discharge.  Cardiovascular:     Rate and Rhythm: Normal rate and regular rhythm.  Pulmonary:     Effort: Pulmonary effort is normal. No respiratory distress.  Musculoskeletal:        General: No deformity.     Comments: Some soft tissue swelling especially at the ulnar aspect of the right wrist.  Some tenderness over the anatomic snuffbox.  She has pain with flexion, extension at the affected wrist.   No palpable subluxation of the scapholunate region on ulnar deviation.  Skin:    General: Skin is warm and dry.  Neurological:     Mental Status: She is alert and oriented to person, place, and time.  Psychiatric:        Mood and Affect: Mood normal.        Behavior: Behavior normal.     ED Results / Procedures / Treatments   Labs (all labs ordered are listed, but only abnormal results are displayed) Labs Reviewed - No data to display  EKG None  Radiology DG Wrist Complete Right  Result Date: 08/15/2023 CLINICAL DATA:  Right wrist injury after fall yesterday EXAM: RIGHT WRIST - COMPLETE 3+ VIEW COMPARISON:  None Available. 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 Due to overlap of irregular bony structures it is difficult to fully characterize the scaphoid but I do not see a definite scaphoid fracture. If the patient has new pain along the anatomic snuffbox then cross-sectional imaging such as CT or MRI may be indicated. Degenerative findings at the first MCP joint. Dorsal spurring at the interphalangeal joint of  the thumb. Suspected subcutaneous edema along the wrist laterally. Subtle notching along the ulnar styloid could be an indicator of old fracture or less likely subacute fracture. Dorsal spurring along the carpus without a well-defined fragment. Elevated scapholunate angle can be seen in setting of scapholunate ligament tear. IMPRESSION: 1. Subcutaneous edema along the lateral wrist. 2. Subtle notching along the ulnar styloid could be an indicator of old fracture or less likely subacute fracture. 3. Elevated scapholunate angle can be seen in setting of scapholunate ligament tear. 4. 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 5. Degenerative findings at the first MCP joint and interphalangeal joint of the  thumb. 6. No definite fracture. If the pain has new tenderness along the anatomic snuffbox then consider cross-sectional imaging such as CT or MRI. Electronically Signed   By: Gaylyn Rong M.D.   On: 08/15/2023 13:40    Procedures Procedures    Medications Ordered in ED Medications - No data to display  ED Course/ Medical Decision Making/ A&P                                 Medical Decision Making Amount and/or Complexity of Data Reviewed Radiology: ordered.   This patient is a 77 y.o. female who presents to the ED for concern of right wrist pain.   Differential diagnoses prior to evaluation: Wrist fracture, dislocation, scaphoid fracture, septic arthritis, contusion, versus other  Past Medical History / Social History / Additional history: Chart reviewed. Pertinent results include: Overall noncontributory  Physical Exam: Physical exam performed. The pertinent findings include: Some soft tissue swelling especially at the ulnar aspect of the right wrist.  Some tenderness over the anatomic snuffbox.  She has pain with flexion, extension at the affected wrist.  Intact rom passively however. No palpable subluxation of the scapholunate region on ulnar deviation.   I independently interpreted imaging including plain film radiograph the right wrist which shows  1. Subcutaneous edema along the lateral wrist.  2. Subtle notching along the ulnar styloid could be an indicator of  old fracture or less likely subacute fracture.  3. Elevated scapholunate angle can be seen in setting of  scapholunate ligament tear.  4. Severe degenerative arthropathy at the first carpometacarpal  junction and between the scaphoid and the trapezium and trapezoid.  5. Degenerative findings at the first MCP joint and interphalangeal  joint of the thumb.  6. No definite fracture. If the pain has new tenderness along the  anatomic snuffbox then consider cross-sectional imaging such as CT  or MRI.  . I  agree with the radiologist interpretation.  Consults: Spoke with Dr. Romeo Apple with orthopedics and after review of her x-ray findings and discussion of her case he does recommend splinting and follow-up in his office.  Medications / Treatment: Given tenderness over anatomic snuffbox, questionable scapholunate ligament disruption patient placed in volar splint and encouraged to follow-up closely with orthopedics, encouraged rest, ice, compression, elevation, ibuprofen, Tylenol, patient understands agrees to plan   Disposition: After consideration of the diagnostic results and the patients response to treatment, I feel that patient stable for discharge with plan as above.   emergency department workup does not suggest an emergent condition requiring admission or immediate intervention beyond what has been performed at this time. The plan is: as above. The patient is safe for discharge and has been instructed to return immediately  for worsening symptoms, change in symptoms or any other concerns.  Final Clinical Impression(s) / ED Diagnoses Final diagnoses:  Closed nondisplaced fracture of styloid process of right ulna, sequela  Partial tear of right scapholunate ligament, initial encounter  Sprain of right wrist, initial encounter    Rx / DC Orders ED Discharge Orders     None         [place] 08/15/23 1532    Rondel Baton, MD 08/15/23 1756

## 2023-08-19 ENCOUNTER — Ambulatory Visit: Payer: Medicare Other | Admitting: Orthopedic Surgery

## 2023-08-19 VITALS — Ht 61.0 in | Wt 148.0 lb

## 2023-08-19 DIAGNOSIS — M25531 Pain in right wrist: Secondary | ICD-10-CM | POA: Diagnosis not present

## 2023-08-19 NOTE — Progress Notes (Signed)
New patient from the emergency room. Chief Complaint  Patient presents with   Wrist Injury    Right wrist injury, DOI 08-14-23. ER follow up.    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  The patient says she has no recollection of any pain in her wrist Preethi fall.  She says now that the splint is off she actually feels much better just complaining of some mild pain along the [facility] joint and dorsum of the wrist  She denies any neurovascular symptoms on review of systems  Problem list, medical hx, medications and allergies reviewed   Ht 5\' 1"  (1.549 m)   Wt 148 lb (67.1 kg)   BMI 27.96 kg/m   Normal appearance  Awake alert and oriented x 3  Neurovascular exam intact  Examined wrist  Right wrist no tenderness noted over the ulnar styloid she has some mild tenderness in the wrist joint itself and tenderness in the [facility] area with decreased range of motion some crepitance decreased pinch and flexion to the small finger opposition  X-rays show [facility] joint arthritis scaphoid trapezial arthritis abnormality of the ulna but I do not think it is fractured  Patient will resume normal activity no splint needed follow-up as needed  Encounter Diagnosis  Name Primary?   Acute wrist pain, right Yes

## 2023-09-24 DIAGNOSIS — H353122 Nonexudative age-related macular degeneration, left eye, intermediate dry stage: Secondary | ICD-10-CM | POA: Diagnosis not present

## 2023-09-24 DIAGNOSIS — H2513 Age-related nuclear cataract, bilateral: Secondary | ICD-10-CM | POA: Diagnosis not present

## 2023-09-24 DIAGNOSIS — H353211 Exudative age-related macular degeneration, right eye, with active choroidal neovascularization: Secondary | ICD-10-CM | POA: Diagnosis not present

## 2023-09-24 DIAGNOSIS — H43823 Vitreomacular adhesion, bilateral: Secondary | ICD-10-CM | POA: Diagnosis not present

## 2023-10-13 DIAGNOSIS — R7309 Other abnormal glucose: Secondary | ICD-10-CM | POA: Diagnosis not present

## 2023-10-13 DIAGNOSIS — Z0001 Encounter for general adult medical examination with abnormal findings: Secondary | ICD-10-CM | POA: Diagnosis not present

## 2023-10-13 DIAGNOSIS — Z1331 Encounter for screening for depression: Secondary | ICD-10-CM | POA: Diagnosis not present

## 2023-10-13 DIAGNOSIS — Z6828 Body mass index (BMI) 28.0-28.9, adult: Secondary | ICD-10-CM | POA: Diagnosis not present

## 2023-10-13 DIAGNOSIS — F418 Other specified anxiety disorders: Secondary | ICD-10-CM | POA: Diagnosis not present

## 2023-10-13 DIAGNOSIS — E663 Overweight: Secondary | ICD-10-CM | POA: Diagnosis not present

## 2023-11-19 DIAGNOSIS — H2513 Age-related nuclear cataract, bilateral: Secondary | ICD-10-CM | POA: Diagnosis not present

## 2023-11-19 DIAGNOSIS — H353211 Exudative age-related macular degeneration, right eye, with active choroidal neovascularization: Secondary | ICD-10-CM | POA: Diagnosis not present

## 2023-11-19 DIAGNOSIS — H43823 Vitreomacular adhesion, bilateral: Secondary | ICD-10-CM | POA: Diagnosis not present

## 2023-11-19 DIAGNOSIS — H353122 Nonexudative age-related macular degeneration, left eye, intermediate dry stage: Secondary | ICD-10-CM | POA: Diagnosis not present

## 2024-01-14 DIAGNOSIS — H353122 Nonexudative age-related macular degeneration, left eye, intermediate dry stage: Secondary | ICD-10-CM | POA: Diagnosis not present

## 2024-01-14 DIAGNOSIS — H2513 Age-related nuclear cataract, bilateral: Secondary | ICD-10-CM | POA: Diagnosis not present

## 2024-01-14 DIAGNOSIS — H43823 Vitreomacular adhesion, bilateral: Secondary | ICD-10-CM | POA: Diagnosis not present

## 2024-01-14 DIAGNOSIS — H353211 Exudative age-related macular degeneration, right eye, with active choroidal neovascularization: Secondary | ICD-10-CM | POA: Diagnosis not present

## 2024-03-10 DIAGNOSIS — H353211 Exudative age-related macular degeneration, right eye, with active choroidal neovascularization: Secondary | ICD-10-CM | POA: Diagnosis not present

## 2024-03-10 DIAGNOSIS — H43823 Vitreomacular adhesion, bilateral: Secondary | ICD-10-CM | POA: Diagnosis not present

## 2024-03-10 DIAGNOSIS — H2513 Age-related nuclear cataract, bilateral: Secondary | ICD-10-CM | POA: Diagnosis not present

## 2024-03-10 DIAGNOSIS — H353122 Nonexudative age-related macular degeneration, left eye, intermediate dry stage: Secondary | ICD-10-CM | POA: Diagnosis not present
